# Patient Record
Sex: Female | Born: 1960 | Race: White | Hispanic: No | Marital: Single | Smoking: Former smoker
Health system: Southern US, Community
[De-identification: ages and names within clinical notes are randomized; demographics above are authoritative.]

## PROBLEM LIST (undated history)

## (undated) DIAGNOSIS — F319 Bipolar disorder, unspecified: Secondary | ICD-10-CM

## (undated) DIAGNOSIS — K759 Inflammatory liver disease, unspecified: Secondary | ICD-10-CM

## (undated) DIAGNOSIS — D649 Anemia, unspecified: Secondary | ICD-10-CM

## (undated) DIAGNOSIS — F419 Anxiety disorder, unspecified: Secondary | ICD-10-CM

## (undated) DIAGNOSIS — I10 Essential (primary) hypertension: Secondary | ICD-10-CM

## (undated) DIAGNOSIS — M1990 Unspecified osteoarthritis, unspecified site: Secondary | ICD-10-CM

## (undated) DIAGNOSIS — F603 Borderline personality disorder: Secondary | ICD-10-CM

## (undated) DIAGNOSIS — F4310 Post-traumatic stress disorder, unspecified: Secondary | ICD-10-CM

## (undated) DIAGNOSIS — G709 Myoneural disorder, unspecified: Secondary | ICD-10-CM

## (undated) DIAGNOSIS — F32A Depression, unspecified: Secondary | ICD-10-CM

## (undated) DIAGNOSIS — K7460 Unspecified cirrhosis of liver: Secondary | ICD-10-CM

## (undated) HISTORY — PX: EYE SURGERY: SHX253

## (undated) HISTORY — PX: SKIN SURGERY: SHX2413

## (undated) HISTORY — PX: CHOLECYSTECTOMY: SHX55

## (undated) HISTORY — PX: VAGINAL HYSTERECTOMY: SUR661

## (undated) HISTORY — PX: DILATION AND CURETTAGE OF UTERUS: SHX78

---

## 2017-01-22 DIAGNOSIS — F4310 Post-traumatic stress disorder, unspecified: Secondary | ICD-10-CM | POA: Insufficient documentation

## 2017-01-22 DIAGNOSIS — F603 Borderline personality disorder: Secondary | ICD-10-CM | POA: Insufficient documentation

## 2019-10-25 HISTORY — PX: GASTRIC BYPASS: SHX52

## 2019-12-25 DIAGNOSIS — C801 Malignant (primary) neoplasm, unspecified: Secondary | ICD-10-CM

## 2019-12-25 HISTORY — DX: Malignant (primary) neoplasm, unspecified: C80.1

## 2020-12-20 HISTORY — PX: WRIST FRACTURE SURGERY: SHX121

## 2020-12-24 HISTORY — PX: HERNIA REPAIR: SHX51

## 2020-12-27 DIAGNOSIS — Z87891 Personal history of nicotine dependence: Secondary | ICD-10-CM | POA: Insufficient documentation

## 2020-12-27 DIAGNOSIS — K529 Noninfective gastroenteritis and colitis, unspecified: Secondary | ICD-10-CM | POA: Insufficient documentation

## 2021-09-19 ENCOUNTER — Other Ambulatory Visit: Payer: Self-pay | Admitting: Radiation Oncology

## 2021-09-19 DIAGNOSIS — C210 Malignant neoplasm of anus, unspecified: Secondary | ICD-10-CM

## 2021-09-19 NOTE — Progress Notes (Signed)
[facility] [address], [place] [address]   CLINIC:  Medical Oncology/Hematology  CONSULT NOTE  Patient Care Team: Brien Mates, RN as Oncology Nurse Navigator (Oncology) Derek Jack, MD as Medical Oncologist (Oncology)  CHIEF COMPLAINTS/PURPOSE OF CONSULTATION:  Evaluation of anal cancer  HISTORY OF PRESENTING ILLNESS:  [name] 60 y.o. female is here because of evaluation of anal cancer, at the request of [place].  Today she reports feeling well, and she is accompanied by her sister. She reports her last coloscopy was in 2020 in [place]. She completed radiation therapy at the end of December 2021. She reports pain in her right hip and buttocks bilaterally. She reports constant headache. She has lost weight intentionally with gastric by-pass surgery, and then unintentionally with radiation therapy; she reports that she cannot regain weight despite having a good appetite. She denies losing any weight since the conclusion of her radiation therapy in December 2021. She reports history of anemia. She moved to [place] from [place] in August, and she currently lives at home with her sister. She is not currently working, but she previously worked for [employer]. She quit smoking 20 years ago after smoking 1 ppd for 15 years. Her maternal uncle had rectal cancer metastasized to the bones, and her brother had brain cancer.   MEDICAL HISTORY:  History reviewed. No pertinent past medical history.  SURGICAL HISTORY: ** The histories are not reviewed yet. Please review them in the "History" navigator section and refresh this [place].  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   Drug use: Never   Sexual activity: Not Currently  Other Topics Concern   Not on file  Social History Narrative   Not on file   Social Determinants of Health   Financial Resource Strain: Low Risk    Difficulty of Paying Living Expenses: Not hard at all  Food Insecurity: No Food Insecurity   Worried About Charity fundraiser in the Last Year: Never true   [place] in the Last Year: Never true  Transportation Needs: No Transportation Needs   Lack of Transportation (Medical): No   Lack of Transportation (Non-Medical): No  Physical Activity: Inactive   Days of Exercise per Week: 0 days   Minutes of Exercise per Session: 0 min  Stress: Stress Concern Present   Feeling of Stress : To some extent  Social Connections: Moderately Isolated   Frequency of Communication with Friends and Family: More than three times a week   Frequency of Social Gatherings with Friends and Family: More than three times a week   Attends Religious Services: More than 4 times per year   Active Member of [employer] or Organizations: No   Attends Archivist Meetings: Never   Marital Status: Never married  Human resources officer Violence: Not At Risk   Fear  of Current or Ex-Partner: No   Emotionally Abused: No   Physically Abused: No   Sexually Abused: No    FAMILY HISTORY: Family History  Problem Relation Age of Onset   Brain cancer Brother    Rectal cancer Maternal Uncle        met to bones    ALLERGIES:  is allergic to lisinopril.  MEDICATIONS:  Current Outpatient Medications  Medication Sig Dispense Refill   Lurasidone HCl (LATUDA PO) Take by mouth. Going to be weaned off of this     Multiple Vitamin (MULTIVITAMIN) tablet Take 1 tablet by mouth daily.     traZODone  (DESYREL) 100 MG tablet Take 200 mg by mouth at bedtime as needed.     venlafaxine (EFFEXOR) 100 MG tablet Take by mouth.     No current facility-administered medications for this visit.    REVIEW OF SYSTEMS:   Review of Systems  Constitutional:  Positive for fatigue. Negative for appetite change and unexpected weight change.  Cardiovascular:  Positive for chest pain and palpitations.  Gastrointestinal:  Positive for blood in stool and constipation.  Genitourinary:  Positive for difficulty urinating.   Musculoskeletal:  Positive for arthralgias (R hip), back pain and myalgias (buttock).  Neurological:  Positive for dizziness and headaches.  Psychiatric/Behavioral:  Positive for depression. The patient is nervous/anxious.   All other systems reviewed and are negative.   PHYSICAL EXAMINATION: ECOG PERFORMANCE STATUS: 1 - Symptomatic but completely ambulatory  Vitals:   09/20/21 1334  BP: 121/81  Pulse: 70  Resp: 18  Temp: 98.2 F (36.8 C)  SpO2: 100%   Filed Weights   09/20/21 1334  Weight: 139 lb 9.6 oz (63.3 kg)   Physical Exam Vitals reviewed.  Constitutional:      Appearance: Normal appearance.  Cardiovascular:     Rate and Rhythm: Normal rate and regular rhythm.     Pulses: Normal pulses.     Heart sounds: Normal heart sounds.  Pulmonary:     Effort: Pulmonary effort is normal.     Breath sounds: Normal breath sounds.  Abdominal:     Palpations: Abdomen is soft. There is no hepatomegaly, splenomegaly or mass.     Tenderness: There is no abdominal tenderness.  Musculoskeletal:     Right lower leg: No edema.     Left lower leg: No edema.  Lymphadenopathy:     Cervical: No cervical adenopathy.     Right cervical: No superficial cervical adenopathy.    Left cervical: No superficial cervical adenopathy.     Upper Body:     Right upper body: No supraclavicular, axillary or pectoral adenopathy.     Left upper body: No supraclavicular, axillary or pectoral  adenopathy.     Lower Body: No right inguinal adenopathy. No left inguinal adenopathy.  Neurological:     General: No focal deficit present.     Mental Status: She is alert and oriented to person, place, and time.  Psychiatric:        Mood and Affect: Mood normal.        Behavior: Behavior normal.     LABORATORY DATA:  I have reviewed the data as listed No flowsheet data found. No flowsheet data found.  RADIOGRAPHIC STUDIES: I have personally reviewed the radiological images as listed and agreed with the findings in the report. No results found.  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 - XRT 5400 cGy, 30 fractions, from 10/31/2020 through 12/22/2020 with 5-FU and Mitomycin-C - PET CT scan on 03/08/2021 with no evidence of metastatic disease.  Complete resolution of perirectal lymphadenopathy and right perianal uptake. - Reportedly had anoscopy by Dr. Windle Guard in July 2022 in [place].  2.  Social/family history: - She moved to [place] area from [place] in August 2022 and currently lives with her sister and husband.  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  She smoked 1 pack/day for 15 years and quit 20 years ago. - Maternal uncle died of rectal cancer and a brother died of brain tumor.   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 - She has occasional bleeding per rectum since the beginning of the year, thought to be secondary to hemorrhoids. - She had endoscopy in July 2022 by Dr. Windle Guard in [place] without any evidence of local recurrence.  She developed numbness in the right upper and lower extremity earlier this year, and was worked up  with brain imaging prior to her move to [place].  She also has some numbness in the lower back and buttock region, likely from radiation. - Physical exam today did not reveal any palpable inguinal lymphadenopathy. - Recommend anoscopy 6 to 12 months and CT scan of the abdomen and pelvis yearly during the first 3 years. - She will have anoscopy to have next year.  We will make a referral to GI service.  She will also likely need a colonoscopy as her last colonoscopy was reportedly in 2020 with a polyp removed.  2.  Gastric bypass surgery: - She had gastric bypass surgery about 2 years ago. - Her weight has been stable since she received treatments. - We will check for nutritional deficiencies including I33, folic acid and iron panel.  She will have her labs drawn and [facility].   All questions were answered. The patient knows to call the clinic with any problems, questions or concerns.   Derek Jack, MD, 09/20/21 2:29 PM  [facility] [phone]   I, Thana Ates, am acting as a scribe for Dr. Derek Jack.  I, Derek Jack MD, have reviewed the above documentation for accuracy and completeness, and I agree with the above.

## 2021-09-20 ENCOUNTER — Other Ambulatory Visit: Payer: Self-pay

## 2021-09-20 ENCOUNTER — Encounter (HOSPITAL_COMMUNITY): Payer: Self-pay | Admitting: Hematology

## 2021-09-20 ENCOUNTER — Inpatient Hospital Stay (HOSPITAL_COMMUNITY): Payer: 59 | Attending: Hematology | Admitting: Hematology

## 2021-09-20 VITALS — BP 121/81 | HR 70 | Temp 98.2°F | Resp 18 | Ht 66.0 in | Wt 139.6 lb

## 2021-09-20 DIAGNOSIS — C210 Malignant neoplasm of anus, unspecified: Secondary | ICD-10-CM | POA: Insufficient documentation

## 2021-09-20 DIAGNOSIS — Z87891 Personal history of nicotine dependence: Secondary | ICD-10-CM | POA: Diagnosis not present

## 2021-09-20 DIAGNOSIS — Z23 Encounter for immunization: Secondary | ICD-10-CM | POA: Insufficient documentation

## 2021-09-20 DIAGNOSIS — Z800 Family history of malignant neoplasm of digestive organs: Secondary | ICD-10-CM | POA: Insufficient documentation

## 2021-09-20 DIAGNOSIS — Z9884 Bariatric surgery status: Secondary | ICD-10-CM | POA: Diagnosis not present

## 2021-09-20 MED ORDER — INFLUENZA VAC SPLIT QUAD 0.5 ML IM SUSY
0.5000 mL | PREFILLED_SYRINGE | Freq: Once | INTRAMUSCULAR | Status: AC
  Administered 2021-09-20: 0.5 mL via INTRAMUSCULAR
  Filled 2021-09-20: qty 0.5

## 2021-09-20 NOTE — Patient Instructions (Signed)
[place] at [facility] Discharge Instructions  You were seen and examined today by Dr. Delton Coombes. Dr. Delton Coombes is a medical oncologist, meaning he specializes in the management of cancer diagnoses. Dr. Delton Coombes discussed your past medical history, including your previous diagnosis of anal cancer.  As of right now, your cancer is completely in remission. Dr. Delton Coombes has recommended 6 month follow-up with a repeat scan and establishment with a gastrointestinal doctor.   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 There is no medication for numbness but if you begin to experience burning or pain related to it, please call the clinic.  Dr. Delton Coombes has recommended additional lab work today. Dr. Delton Coombes will check your blood counts, electrolytes and iron studies.  Follow-up as scheduled.   Thank you for choosing [place] at [facility] to provide your oncology and hematology care.  To afford each patient quality time with our provider, please arrive at least 15 minutes before your scheduled appointment time.   If you have a lab appointment with the [facility] please come in thru the Main Entrance and check in at the main information desk.  You need to re-schedule your appointment should you arrive 10 or more minutes late.  We strive to give you quality time with our providers, and arriving late affects you and other patients whose appointments are after yours.  Also, if you no show three or more times for appointments you may be dismissed from the clinic at the providers discretion.     Again, thank you for choosing [facility].  Our hope is that these requests will decrease the amount of time that you wait before being seen by our physicians.       _____________________________________________________________  Should you have questions after your visit to [facility], please contact  our office at [phone] and follow the prompts.  Our office hours are 8:00 a.m. and 4:30 p.m. Monday - Friday.  Please note that voicemails left after 4:00 p.m. may not be returned until the following business day.  We are closed weekends and major holidays.  You do have access to a nurse 24-7, just call the main number to the clinic [phone] and do not press any options, hold on the line and a nurse will answer the phone.    For prescription refill requests, have your pharmacy contact our office and allow 72 hours.    Due to Covid, you will need to wear a mask upon entering the hospital. If you do not have a mask, a mask will be given to you at the Main Entrance upon arrival. For doctor visits, patients may have 1 support person age 100 or older with them. For treatment visits, patients can not have anyone with them due to social distancing guidelines and our immunocompromised population.

## 2021-09-20 NOTE — Progress Notes (Signed)
Patient to receive flu shot today per Dr. Katragadda. Patient reports no prior adverse reactions to previous flu shots. Flu shot given per Dr. Katragadda. Patient tolerated well without complaint. Site covered with bandaid. Patient discharged from clinic ambulatory and in satisfactory condition with family.  

## 2021-11-22 ENCOUNTER — Telehealth (HOSPITAL_COMMUNITY): Payer: Self-pay | Admitting: *Deleted

## 2021-11-22 NOTE — Telephone Encounter (Signed)
Patient called stating that she is having intractable lower back pain.  Takes tylenol and utilizes lidoderm patches without relief.  She verbalizes that this has just started recently and is s/p radiation to that area.  Would like a follow up appointment to see Dr Delton Coombes to assess.

## 2021-11-27 NOTE — Progress Notes (Signed)
[name], [place] [address]   CLINIC:  Medical Oncology/Hematology  PCP:  No primary care provider on file. No primary physician on file. None   REASON FOR VISIT:  Follow-up for anal cancer  PRIOR THERAPY: XRT completed December 2021  NGS Results: not done  CURRENT THERAPY: under work-up  BRIEF ONCOLOGIC HISTORY:  Oncology History  Anal cancer ([place])  09/20/2021 Initial Diagnosis   Anal cancer ([place])   09/20/2021 Cancer Staging   Staging form: Anus, AJCC 8th Edition - Clinical stage from 09/20/2021: Stage IIIA (ycT2, cN1a, cM0) - Signed by Derek Jack, MD on 09/20/2021 Stage prefix: Post-therapy Histologic grade (G): G3 Histologic grading system: 4 grade system P16 overexpression: Positive      CANCER STAGING:  Cancer Staging  Anal cancer ([place]) Staging form: Anus, AJCC 8th Edition - Clinical stage from 09/20/2021: Stage IIIA (ycT2, cN1a, cM0) - Signed by Derek Jack, MD on 09/20/2021   INTERVAL HISTORY:  [name], a 60 y.o. female, returns for routine follow-up of her anal cancer. [name] was last seen on 09/20/2021.   Today she reports feeling well. She reports a history of fatty liver. She reports constant dull lower back pain which radiates to the buttock starting 1 month ago which she rates 7 out of 10 at its worse and 3 out of 10 at its baseline; this pain is worsened by bending over and improved by laying flat. She is taking three 500 mg tylenol tablets every 8 hours as needed for this pain.   REVIEW OF SYSTEMS:  Review of Systems  Constitutional:  Negative for appetite change.  Musculoskeletal:  Positive for back pain (6/10 lower).  Neurological:  Positive for numbness.  Psychiatric/Behavioral:  Positive for depression.   All other systems reviewed and are negative.  PAST MEDICAL/SURGICAL HISTORY:  No past medical history on file. Past Surgical History:  Procedure Laterality Date    GASTRIC BYPASS  10/2019   HERNIA REPAIR  2022   hernia repair and mass removal   WRIST FRACTURE SURGERY Left 12/20/2020    SOCIAL HISTORY:  Social History   Socioeconomic History   Marital status: Single    Spouse name: Not on file   Number of children: Not on file   Years of education: Not on file   Highest education level: Not on file  Occupational History   Not on file  Tobacco Use   Smoking status: Former    Types: Cigarettes    Quit date: 2002    Years since quitting: 20.9   Smokeless tobacco: Never  Vaping Use   Vaping Use: Never used  Substance and Sexual Activity   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   Drug use: Never   Sexual activity: Not Currently  Other Topics Concern   Not on file  Social History Narrative   Not on file   Social Determinants of Health   Financial Resource Strain: Low Risk    Difficulty of Paying Living Expenses: Not hard at all  Food Insecurity: No Food Insecurity   Worried About Charity fundraiser in the Last Year: Never true   [place] in the Last Year: Never true  Transportation Needs: No Transportation Needs   Lack of Transportation (Medical): No   Lack of Transportation (Non-Medical): No  Physical Activity: Inactive   Days of Exercise per Week: 0 days   Minutes of Exercise per  Session: 0 min  Stress: Stress Concern Present   Feeling of Stress : To some extent  Social Connections: Moderately Isolated   Frequency of Communication with Friends and Family: More than three times a week   Frequency of Social Gatherings with Friends and Family: More than three times a week   Attends Religious Services: More than 4 times per year   Active Member of [employer] or Organizations: No   Attends Music therapist: Never   Marital Status: Never married  Human resources officer Violence: Not At Risk   Fear of Current or Ex-Partner: No   Emotionally Abused: No   Physically Abused: No   Sexually Abused:  No    FAMILY HISTORY:  Family History  Problem Relation Age of Onset   Brain cancer Brother    Rectal cancer Maternal Uncle        met to bones    CURRENT MEDICATIONS:  Current Outpatient Medications  Medication Sig Dispense Refill   diphenoxylate-atropine (LOMOTIL) 2.5-0.025 MG tablet Take by mouth.     gabapentin (NEURONTIN) 300 MG capsule Take 300 mg by mouth daily.     lamoTRIgine (LAMICTAL) 25 MG tablet Take by mouth.     Lurasidone HCl (LATUDA PO) Take by mouth. Going to be weaned off of this     Multiple Vitamin (MULTIVITAMIN) tablet Take 1 tablet by mouth daily.     omeprazole (PRILOSEC) 20 MG capsule      venlafaxine (EFFEXOR) 100 MG tablet Take by mouth.     traZODone (DESYREL) 100 MG tablet Take 200 mg by mouth at bedtime as needed. (Patient not taking: Reported on 11/28/2021)     No current facility-administered medications for this visit.    ALLERGIES:  Allergies  Allergen Reactions   Lisinopril Cough    PHYSICAL EXAM:  Performance status (ECOG): 1 - Symptomatic but completely ambulatory  There were no vitals filed for this visit. Wt Readings from Last 3 Encounters:  09/20/21 139 lb 9.6 oz (63.3 kg)   Physical Exam Vitals reviewed.  Constitutional:      Appearance: Normal appearance.  Cardiovascular:     Rate and Rhythm: Normal rate and regular rhythm.     Pulses: Normal pulses.     Heart sounds: Normal heart sounds.  Pulmonary:     Effort: Pulmonary effort is normal.     Breath sounds: Normal breath sounds.  Musculoskeletal:     Lumbar back: Tenderness (over sacrum) present.  Neurological:     General: No focal deficit present.     Mental Status: She is alert and oriented to person, place, and time.  Psychiatric:        Mood and Affect: Mood normal.        Behavior: Behavior normal.     LABORATORY DATA:  I have reviewed the labs as listed.  No flowsheet data found. No flowsheet data found.  DIAGNOSTIC IMAGING:  I have independently  reviewed the scans and discussed with the patient. No results found.   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 - XRT 5400 cGy, 30 fractions, from 10/31/2020 through 12/22/2020 with 5-FU and Mitomycin-C - PET CT scan on 03/08/2021 with no evidence of metastatic disease.  Complete resolution of perirectal lymphadenopathy and right perianal uptake. - Reportedly had anoscopy by Dr.  Conner in July 2022 in [place]. - Endoscopy in July 2022 by Dr. Windle Guard in [place] without any evidence of local recurrence.  She developed numbness in the right upper and lower extremity earlier this year, worked up with brain imaging prior to her moved to [place].  She also has some numbness in the lower back and buttock region, likely from radiation.  2.  Social/family history: - She moved to [place] area from [place] in August 2022 and currently lives with her sister and husband.  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  She smoked 1 pack/day for 15 years and quit 20 years ago. - Maternal uncle died of rectal cancer and a brother died of brain tumor.   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 Barry Dienes on 11/27/2021. - Anoscopy did not reveal any recurrence of tumor. - She has a follow-up with Dr. Barry Dienes in 3 months. - We will plan to arrange for a CT AP with contrast around March of next year.  Will do imaging once a year.  2.  Gastric bypass surgery: -She had a gastric bypass surgery about 2 years ago. - We have reviewed labs from 09/20/2021 which showed normal V85, folic acid and ferritin levels.  3.  Low back pain: - She reports 1 month history of low back pain.  No radiation.  Baseline it is 3  out of 10 and can get to 6-7 out of 10.  Reported as dull pain.  She is taking Tylenol 500 mg up to 3 tablets daily.  She is not able to take NSAIDs because of her history of gastric bypass.  Pain is worse on bending over and gets better on lying flat. - I have recommended x-ray of the lumbar spine. - We will start her on Flexeril 5 mg every 8 hours as needed.  If there is no improvement, will consider tramadol. - RTC 6 weeks for follow-up.  She was told to call us if the pain is not improving.  We will consider MRI of the lumbar spine.   Orders placed this encounter:  Orders Placed This Encounter  Procedures   DG Lumbar Spine Complete      Derek Jack, MD [place] [phone]   I, Thana Ates, am acting as a scribe for Dr. Derek Jack.  I, Derek Jack MD, have reviewed the above documentation for accuracy and completeness, and I agree with the above.

## 2021-11-28 ENCOUNTER — Inpatient Hospital Stay (HOSPITAL_COMMUNITY): Payer: 59 | Attending: Hematology | Admitting: Hematology

## 2021-11-28 ENCOUNTER — Other Ambulatory Visit: Payer: Self-pay

## 2021-11-28 ENCOUNTER — Ambulatory Visit (HOSPITAL_COMMUNITY)
Admission: RE | Admit: 2021-11-28 | Discharge: 2021-11-28 | Disposition: A | Discharge status: Home / Self Care | Payer: 59 | Source: Ambulatory Visit | Attending: Hematology | Admitting: Hematology

## 2021-11-28 DIAGNOSIS — C210 Malignant neoplasm of anus, unspecified: Secondary | ICD-10-CM

## 2021-11-28 MED ORDER — CYCLOBENZAPRINE HCL 5 MG PO TABS: 5.0000 mg | ORAL_TABLET | Freq: Three times a day (TID) | ORAL | 0 refills | Status: DC | PRN

## 2021-11-28 NOTE — Patient Instructions (Signed)
[place] at [facility] Discharge Instructions  You were seen and examined today by Dr. Delton Coombes. Have your xray done and call us in 4 weeks if you are still having pain. We will call with the results of your xray. He sent flexeril into the pharmacy for you to take and see if it will help with the pain you are having.   Thank you for choosing [place] at [facility] to provide your oncology and hematology care.  To afford each patient quality time with our provider, please arrive at least 15 minutes before your scheduled appointment time.   If you have a lab appointment with the [facility] please come in thru the Main Entrance and check in at the main information desk.  You need to re-schedule your appointment should you arrive 10 or more minutes late.  We strive to give you quality time with our providers, and arriving late affects you and other patients whose appointments are after yours.  Also, if you no show three or more times for appointments you may be dismissed from the clinic at the providers discretion.     Again, thank you for choosing [facility].  Our hope is that these requests will decrease the amount of time that you wait before being seen by our physicians.       _____________________________________________________________  Should you have questions after your visit to [facility], please contact our office at [phone] and follow the prompts.  Our office hours are 8:00 a.m. and 4:30 p.m. Monday - Friday.  Please note that voicemails left after 4:00 p.m. may not be returned until the following business day.  We are closed weekends and major holidays.  You do have access to a nurse 24-7, just call the main number to the clinic [phone] and do not press any options, hold on the line and a nurse will answer the phone.    For prescription refill requests, have your pharmacy contact our office and  allow 72 hours.    Due to Covid, you will need to wear a mask upon entering the hospital. If you do not have a mask, a mask will be given to you at the Main Entrance upon arrival. For doctor visits, patients may have 1 support person age 69 or older with them. For treatment visits, patients can not have anyone with them due to social distancing guidelines and our immunocompromised population.

## 2021-12-07 ENCOUNTER — Telehealth (HOSPITAL_COMMUNITY): Payer: Self-pay | Admitting: *Deleted

## 2021-12-07 NOTE — Telephone Encounter (Signed)
-----   Message from Derek Jack, MD sent at 12/06/2021  4:03 PM EST ----- Please call this patient with the results of the x-ray of the lumbar spine.  Some degenerative changes contributing to her low back pain.  Thanks.

## 2021-12-07 NOTE — Telephone Encounter (Signed)
Patient called and notified of results.  All questions answered and verbalized understanding.

## 2022-01-22 ENCOUNTER — Ambulatory Visit (HOSPITAL_COMMUNITY): Payer: 59 | Admitting: Hematology

## 2022-01-26 ENCOUNTER — Encounter: Payer: Self-pay | Admitting: Physical Therapy

## 2022-01-26 ENCOUNTER — Other Ambulatory Visit: Payer: Self-pay

## 2022-01-26 ENCOUNTER — Ambulatory Visit: Payer: 59 | Attending: General Surgery | Admitting: Physical Therapy

## 2022-01-26 DIAGNOSIS — C211 Malignant neoplasm of anal canal: Secondary | ICD-10-CM | POA: Diagnosis not present

## 2022-01-26 DIAGNOSIS — N895 Stricture and atresia of vagina: Secondary | ICD-10-CM | POA: Diagnosis not present

## 2022-01-26 DIAGNOSIS — R279 Unspecified lack of coordination: Secondary | ICD-10-CM

## 2022-01-26 DIAGNOSIS — M6281 Muscle weakness (generalized): Secondary | ICD-10-CM

## 2022-01-26 DIAGNOSIS — M62838 Other muscle spasm: Secondary | ICD-10-CM

## 2022-01-26 NOTE — Patient Instructions (Addendum)
[email or web address].[email or web address]   Lubrication Used for intercourse to reduce friction Avoid ones that have glycerin, nonoxynol-9, petroleum, propylene glycol, chlorhexidine gluconate, warming gels, tingling gels, icing or cooling gel, scented Avoid parabens due to a preservative similar to female sex hormone May need to be reapplied once or several times during sexual activity Can be applied to both partners genitals prior to vaginal penetration to minimize friction or irritation Prevent irritation and mucosal tears that cause post coital pain and increased the risk of vaginal and urinary tract infections Oil-based lubricants cannot be used with condoms due to breaking them down.  Least likely to irritate vaginal tissue.  Plant based-lubes are safe Silicone-based lubrication are thicker and last long and used for post-menopausal women  Vaginal Lubricators Here is a list of some suggested lubricators you can use for intercourse. Use the most hypoallergenic product.  You can place on you or your partner.  Slippery Stuff ( water based) Sylk or Sliquid Natural H2O ( good  if frequent UTIs)- walmart, amazon Sliquid organics silk-(aloe and silicone based ) [employer] (www.blossom-organics.com)- (aloe based ) Coconut oil, olive oil -not good with condoms  PJur Woman Nude- (water based) amazon Uberlube- ( silicon) [employer] has an organic one Yes lubricant- (water based and has plant oil based similar to silicone) [employer] Platinum-Silicone, [employer], [facility] Olive and Bee intimate cream-  www.oliveandbee.com.au Pink - [employer] stuff Erosense Sync- walmart, amazon Coconu- FailLinks.co.uk  Things to avoid in lubricants are glycerin, warming gels, tingling gels, icing or cooling  gels, and scented gels.  Also avoid Vaseline. KY jelly, Replens, and Astroglide contain chlorhexidine which kills good bacteria(lactobacilli)  Things to avoid in the vaginal  area Do not use things to irritate the vulvar area No lotions- see below Soaps you  can use :Aveeno, Calendula, Good Clean Love cleanser if needed. Must be gentle No deodorants No douches Good to sleep without underwear to let the vaginal area to air out No scrubbing: spread the lips to let warm water rinse over labias and pat dry  Creams that can be used on the Vulva Area V [employer], walmart Vital V Wild Yam Salve Julva- [employer] Botanical Pro-Meno Wild Yam Cream Coconut oil, olive oil Cleo by [employer] labial moisturizer -[place],  [place] Releveum ( lidocaine) or Desert [employer] Yes Moisturizer

## 2022-01-26 NOTE — Therapy (Signed)
[facility] @ [place], [place], [address] Phone: [phone]   Fax:  [phone]  Physical Therapy Evaluation  Patient Details  Name: Barbara Meyer MRN: [id number] Date of Birth: October 13, 1961 Referring Provider (PT): Leighton Ruff, MD   Encounter Date: 01/26/2022   PT End of Session - 01/26/22 1154     Visit Number 1    Date for PT Re-Evaluation 04/25/22    Authorization Type Aetna    PT Start Time 1107    PT Stop Time 1147    PT Time Calculation (min) 40 min    Activity Tolerance Patient tolerated treatment well    Behavior During Therapy [facility] for tasks assessed/performed             History reviewed. No pertinent past medical history.  Past Surgical History:  Procedure Laterality Date   GASTRIC BYPASS  10/2019   HERNIA REPAIR  2022   hernia repair and mass removal   WRIST FRACTURE SURGERY Left 12/20/2020    There were no vitals filed for this visit.    Subjective Assessment - 01/26/22 1112     Subjective Pt reports she has had pain with intercourse ~61yo but no longer sexually active, diagnosed with anal CA 09/21, chemo/radiation 11/21, surgery for removal in 9/21. Completed treatments. Was given vaginal dilator post treatment however only used one time due pain. Pt now reporting pain with vaginal penetration during medical testing and MD referred her to decrease pelvic pain.    Pertinent History x3 births 1 c-section 2 vaginal; anal cancer    How long can you sit comfortably? no limits    How long can you stand comfortably? no limits    How long can you walk comfortably? no limits    Currently in Pain? 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  No    Is the patient reluctant to leave their home because of a fear of falling?  No      Home Ecologist residence    Living Arrangements Other relatives      Prior Function   Level of Independence Independent      Cognition   Overall Cognitive Status Within Functional Limits for tasks assessed      Sensation   Light Touch Appears Intact      Coordination   Gross Motor Movements are Fluid and Coordinated Yes    Fine Motor Movements are Fluid and Coordinated Yes      Posture/Postural Control   Posture/Postural Control No significant limitations      ROM / Strength   AROM / PROM / Strength AROM;Strength  AROM   Overall AROM Comments thoracic and lumbar spine limited by 25% in side bending all others WFL.      Flexibility   Soft Tissue Assessment /Muscle Length yes   bil hamstrings and adductors limited by 25%     Palpation   Palpation comment no TTP throughout abdomen, bil glutes, quads, adductors, or pubic symphasis                   No emotional/communication barriers or cognitive limitation. Patient is motivated to learn. Patient understands and agrees with treatment goals and plan. PT explains patient will be examined in standing, sitting, and lying down to see how their muscles and joints work. When they are ready, they will be asked to remove their underwear so PT can examine their perineum. The patient is also given the option of providing their own chaperone as one is not provided in our facility. The patient also has the right and is explained the right to defer or refuse any part of the  evaluation or treatment including the internal exam. With the patient's consent, PT will use one gloved finger to gently assess the muscles of the pelvic floor, seeing how well it contracts and relaxes and if there is muscle symmetry. After, the patient will get dressed and PT and patient will discuss exam findings and plan of care. PT and patient discuss plan of care, schedule, attendance policy and HEP activities.      Objective measurements completed on examination: See above findings.     Pelvic Floor Special Questions - 01/26/22 0001     Prior Pelvic/Prostate Exam No    Are you Pregnant or attempting pregnancy? No    Currently Sexually Active No    History of sexually transmitted disease Yes   at 61yo but has been treated   [facility] Scale pain prevents any attempts at intercourse   pt reports she had pain with sex previously but not seeing anyone right now. Does report she would like to be sexually active but worried she is unable   Urinary Leakage Yes    How often "every once in awhile"    Pad use no    Activities that cause leaking Other    Other activities that cause leaking sometimes will have post void leakage but not all the time    Urinary urgency Yes   sometimes but not often   Urinary frequency sometimes going more often than every 2 hours    Fecal incontinence No   denies constipation; sometimes feels like she needs to strain, sometimes will have rectal bleeding but unsure if thats related to straining, reports MD said this was from radiation. Has BM 1-2x per day, usually type 5.   Fluid intake ~64oz  bottle x3 per day    Caffeine beverages coffee 3 cups per day    Falling out feeling (prolapse) No    External Perineal Exam mild dryness noted    Pelvic Floor Internal Exam patient identified and patient confirms consent for PT to perform internal soft tissue work and muscle strength and integrity assessment    Exam Type Vaginal    Sensation WFL    Palpation TTP throughout  Right side in superficial and deep muscle layers    Strength weak squeeze, no lift   however greatly increased tone noted throughout pelvic floor and at introitus   Strength # of reps 6    Strength # of seconds 5    Tone increased  throughout                       PT Education - 01/26/22 1154     Education Details Pt educated on exam findings, POC, HEP, pelvic relaxation and vaginal lumbricants    Person(s) Educated Patient    Methods Explanation;Demonstration;Tactile cues;Verbal cues;Handout    Comprehension Returned demonstration;Verbalized understanding              PT Short Term Goals - 01/26/22 1205       PT SHORT TERM GOAL #1   Title pt to be I with HEP    Time 4    Period Weeks    Status New    Target Date 02/23/22      PT SHORT TERM GOAL #2   Title Pt to report ability to tolerate vaginal penetration with no more than 5/10 pain for improved tolerance to vaginal penetration.    Time 4    Period Weeks    Status New    Target Date 02/23/22               PT Long Term Goals - 01/26/22 1207       PT LONG TERM GOAL #1   Title pt to be I with advanced HEP    Time 3    Period Months    Status New    Target Date 04/25/22      PT LONG TERM GOAL #2   Title pt to report no more than 2/10 pain with vaginal dilator use for improved tolerance to vaginal penetration.    Time 3    Period Months    Status New    Target Date 04/25/22      PT LONG TERM GOAL #3   Title pt to demonstrate improved ability to contract/relax/bulge/relax pelvic floor at least 75% of the time to demonstrate improved pelvic floor tone for less pelvic pain.    Time 3    Period Months    Status New    Target Date 04/25/22      PT LONG TERM GOAL #4   Title pt to demonstrate at least 4/5 pelvic floor strength with ability to fully relax post contraction for improved pelvic stability and decreased pain.    Time 3    Period Months    Status New    Target Date 04/25/22                     Plan - 01/26/22 1155     Clinical Impression Statement Pt is 61yo female presenting to clinic with history of stage 3 anal cancer, found 08/2020 and underwent removal 08/2020, chemo radiation treatment completed 11/2020. Pt has since had PET CT scan on 03/08/2021 with no evidence of metastatic disease. Endoscopy in July 2022 by Dr. Windle Guard in [place] without any evidence of local recurrence per chart review. Pt presents today with pelvic pain with vaginal penetration s/p radiation. Pt does report the last time she had intercourse ~7 years ago, it was somewhat painful. Pt reports after treatment cancer her doctor gave her a vaginal dilator to use wth lubricant and she attempted one time then never tried again due to it being painful. Pt reports she would like to be able to be sexually active but feels limited due to pain and also like she can't initiate a romantic relationship due to these limitations at this time, pt also reports she was unable to tolerate  a vaginal exam or insertion of "imaging wand" during a vaginal exam before due to pain as well. Pt found to have decreased mobility in spine and bil hips, consented to internal vaginal pelvic floor assessment this session and did have increased tone throughout pelvic floor at superficial and deep muscle layers at introitus, pt did report TTP throughout right side but not as much on the left. Pt also demonstrated decreased strength, endurance and coordination with poor ability to relax between contractions noted. Pt would benefit from additional PT to improve QOL and safety without pain for vaginal penetration for medical assessments as needed and for intercourse.    Personal Factors and Comorbidities Age;Time since onset of injury/illness/exacerbation;Comorbidity 1    Comorbidities h/o s3 anal cancer now post treatment and removal.    Examination-Activity Limitations Other   vaginal penetration   Examination-Participation  Restrictions Interpersonal Relationship    Stability/Clinical Decision Making Stable/Uncomplicated    Clinical Decision Making Low    Rehab Potential Good    PT Frequency 1x / week    PT Duration Other (comment)   10 weeks   PT Treatment/Interventions ADLs/Self Care Home Management;Aquatic Therapy;Patient/family education;Therapeutic activities;Functional mobility training;Therapeutic exercise;Neuromuscular re-education;Manual techniques;Taping;Scar mobilization;Passive range of motion;Energy conservation    PT Next Visit Plan go over all handouts, manual work at hips, spine and pelvis, stretching/relaxation    PT Home Exercise Plan [id number]    Consulted and Agree with Plan of Care Patient             Patient will benefit from skilled therapeutic intervention in order to improve the following deficits and impairments:  Decreased coordination, Decreased endurance, Pain, Impaired tone, Increased fascial restricitons, Decreased scar mobility, Impaired flexibility, Improper body mechanics, Postural dysfunction, Decreased strength, Decreased mobility  Visit Diagnosis: Muscle weakness (generalized) - Plan: PT plan of care cert/re-cert  Unspecified lack of coordination - Plan: PT plan of care cert/re-cert  Other muscle spasm - Plan: PT plan of care cert/re-cert     Problem List Patient Active Problem List   Diagnosis Date Noted   Anal cancer ([place]) 09/20/2021   Stacy Gardner, PT, DPT 01/26/2311:10 PM   [facility] @ [place], [place], [address] Phone: [phone]   Fax:  [phone]  Name: Leatha Rohner MRN: [id number] Date of Birth: 1961/08/20

## 2022-01-30 DIAGNOSIS — F419 Anxiety disorder, unspecified: Secondary | ICD-10-CM | POA: Diagnosis not present

## 2022-01-30 DIAGNOSIS — F4312 Post-traumatic stress disorder, chronic: Secondary | ICD-10-CM | POA: Diagnosis not present

## 2022-01-30 DIAGNOSIS — G4700 Insomnia, unspecified: Secondary | ICD-10-CM | POA: Diagnosis not present

## 2022-01-30 DIAGNOSIS — R69 Illness, unspecified: Secondary | ICD-10-CM | POA: Diagnosis not present

## 2022-01-30 DIAGNOSIS — Z79899 Other long term (current) drug therapy: Secondary | ICD-10-CM | POA: Diagnosis not present

## 2022-02-14 ENCOUNTER — Encounter: Payer: 59 | Admitting: Physical Therapy

## 2022-02-19 DIAGNOSIS — F4312 Post-traumatic stress disorder, chronic: Secondary | ICD-10-CM | POA: Diagnosis not present

## 2022-02-19 DIAGNOSIS — G4700 Insomnia, unspecified: Secondary | ICD-10-CM | POA: Diagnosis not present

## 2022-02-19 DIAGNOSIS — F419 Anxiety disorder, unspecified: Secondary | ICD-10-CM | POA: Diagnosis not present

## 2022-02-19 DIAGNOSIS — Z79899 Other long term (current) drug therapy: Secondary | ICD-10-CM | POA: Diagnosis not present

## 2022-02-19 DIAGNOSIS — R69 Illness, unspecified: Secondary | ICD-10-CM | POA: Diagnosis not present

## 2022-02-21 ENCOUNTER — Other Ambulatory Visit: Payer: Self-pay

## 2022-02-21 ENCOUNTER — Ambulatory Visit: Payer: 59 | Attending: General Surgery | Admitting: Physical Therapy

## 2022-02-21 DIAGNOSIS — M6281 Muscle weakness (generalized): Secondary | ICD-10-CM

## 2022-02-21 DIAGNOSIS — M62838 Other muscle spasm: Secondary | ICD-10-CM

## 2022-02-21 DIAGNOSIS — R279 Unspecified lack of coordination: Secondary | ICD-10-CM

## 2022-02-21 NOTE — Therapy (Signed)
[facility] ?[facility] @ [place] ?[place], [place], [address] ?Phone: [phone]   Fax:  [phone] ? ?Physical Therapy Treatment ? ?Patient Details  ?Name: Barbara Meyer ?MRN: [id number] ?Date of Birth: [birth year] ?Referring Provider (PT): Leighton Ruff, MD ? ? ?Encounter Date: 02/21/2022 ? ? PT End of Session - 02/21/22 1103   ? ? Visit Number 2   ? Date for PT Re-Evaluation 04/25/22   ? Authorization Type Aetna   ? PT Start Time 1101   ? PT Stop Time 4097   ? PT Time Calculation (min) 42 min   ? Activity Tolerance Patient tolerated treatment well   ? Behavior During Therapy [facility] for tasks assessed/performed   ? ?  ?  ? ?  ? ? ?No past medical history on file. ? ?Past Surgical History:  ?Procedure Laterality Date  ? GASTRIC BYPASS  10/2019  ? HERNIA REPAIR  2022  ? hernia repair and mass removal  ? WRIST FRACTURE SURGERY Left 12/20/2020  ? ? ?There were no vitals filed for this visit. ? ? Subjective Assessment - 02/21/22 1104   ? ? Subjective Pt reports she has had pain with intercourse ~61yo but no longer sexually active, diagnosed with anal CA 09/21, chemo/radiation 11/21, surgery for removal in 9/21. Completed treatments. Was given vaginal dilator post treatment however only used one time due pain. Pt now reporting pain with vaginal penetration during medical testing and MD referred her to decrease pelvic pain.   ? Pertinent History x3 births 1 c-section 2 vaginal; anal cancer   ? How long can you sit comfortably? no limits   ? How long can you stand comfortably? no limits   ? How long can you walk comfortably? no limits   ? Currently in Pain? No/denies   ? ?  ?  ? ?  ? ? ? ? ? ? ? ? ? ? ? ? ? ? ? ? ? ? ? ? [facility] Adult PT Treatment/Exercise - 02/21/22 0001   ? ?  ? Self-Care  ? Self-Care Other Self-Care Comments   ? Other Self-Care Comments  pt educated on voiding mechanics and breathing mechanics to decrease straining and improve fully emptying of bowels.   ?  ?  Neuro Re-ed   ? Neuro Re-ed Details  x10 "straw" or "balloon" breathing while feet on squatty potty for improved mechanics to decrease straining   ?  ? Exercises  ? Exercises Lumbar;Knee/Hip   ?  ? Lumbar Exercises: Stretches  ? Passive Hamstring Stretch Right;Left;2 reps;30 seconds   ? Single Knee to Chest Stretch --   ? Hip Flexor Stretch --   ? Pelvic Tilt 10 reps   ? ITB Stretch Right;Left;2 reps;30 seconds   ? Piriformis Stretch Right;Left;30 seconds   ? Other Lumbar Stretch Exercise butterfly 2x45s   ? ?  ?  ? ?  ? ? ? ? ? ? ? ? ? ? PT Education - 02/21/22 1143   ? ? Education Details Pt educated on voiding mechanics, breathing mechanics, and continued HEP as well as attempting use of vaginal dilator at home as tolerated.   ? Person(s) Educated Patient   ? Methods Explanation;Demonstration;Tactile cues;Verbal cues;Handout   ? Comprehension Returned demonstration;Verbalized understanding   ? ?  ?  ? ?  ? ? ? PT Short Term Goals - 01/26/22 1205   ? ?  ? PT SHORT TERM GOAL #1  ? Title pt to be I with  HEP   ? Time 4   ? Period Weeks   ? Status New   ? Target Date 02/23/22   ?  ? PT SHORT TERM GOAL #2  ? Title Pt to report ability to tolerate vaginal penetration with no more than 5/10 pain for improved tolerance to vaginal penetration.   ? Time 4   ? Period Weeks   ? Status New   ? Target Date 02/23/22   ? ?  ?  ? ?  ? ? ? ? PT Long Term Goals - 01/26/22 1207   ? ?  ? PT LONG TERM GOAL #1  ? Title pt to be I with advanced HEP   ? Time 3   ? Period Months   ? Status New   ? Target Date 04/25/22   ?  ? PT LONG TERM GOAL #2  ? Title pt to report no more than 2/10 pain with vaginal dilator use for improved tolerance to vaginal penetration.   ? Time 3   ? Period Months   ? Status New   ? Target Date 04/25/22   ?  ? PT LONG TERM GOAL #3  ? Title pt to demonstrate improved ability to contract/relax/bulge/relax pelvic floor at least 75% of the time to demonstrate improved pelvic floor tone for less pelvic pain.   ? Time 3    ? Period Months   ? Status New   ? Target Date 04/25/22   ?  ? PT LONG TERM GOAL #4  ? Title pt to demonstrate at least 4/5 pelvic floor strength with ability to fully relax post contraction for improved pelvic stability and decreased pain.   ? Time 3   ? Period Months   ? Status New   ? Target Date 04/25/22   ? ?  ?  ? ?  ? ? ? ? ? ? ? ? Plan - 02/21/22 1143   ? ? Clinical Impression Statement Pt presents to clinic reporting she was not able to complete HEP until recently and had not tried dilators yet. Pt session focused on stretching at hips and spine for improved mobility and decreased pelvic tension. Pt also educated on mechanics for breathing and voiding with use of step stool for BMs to decrease straining and improve ergonomics. Pt verbalized understanding and portion of treatment used for pt to have NMRE in position with squatty potty for carry over for home and x10 balloon breathing for improved techniques. Pt tolerated well without pain and would benefit from additional PT to improve QOL and safety without pain for vaginal penetration for medical assessments as needed and for intercourse.   ? Personal Factors and Comorbidities Age;Time since onset of injury/illness/exacerbation;Comorbidity 1   ? Comorbidities h/o s3 anal cancer now post treatment and removal.   ? Examination-Activity Limitations Other   vaginal penetration  ? Examination-Participation Restrictions Interpersonal Relationship   ? Stability/Clinical Decision Making Stable/Uncomplicated   ? Rehab Potential Good   ? PT Frequency 1x / week   ? PT Duration Other (comment)   10 weeks  ? PT Treatment/Interventions ADLs/Self Care Home Management;Aquatic Therapy;Patient/family education;Therapeutic activities;Functional mobility training;Therapeutic exercise;Neuromuscular re-education;Manual techniques;Taping;Scar mobilization;Passive range of motion;Energy conservation   ? PT Next Visit Plan , manual work at hips, spine and pelvis,  stretching/relaxation   ? PT Home Exercise Plan [id number]   ? Consulted and Agree with Plan of Care Patient   ? ?  ?  ? ?  ? ? ?Patient will benefit  from skilled therapeutic intervention in order to improve the following deficits and impairments:  Decreased coordination, Decreased endurance, Pain, Impaired tone, Increased fascial restricitons, Decreased scar mobility, Impaired flexibility, Improper body mechanics, Postural dysfunction, Decreased strength, Decreased mobility ? ?Visit Diagnosis: ?Other muscle spasm ? ?Unspecified lack of coordination ? ?Muscle weakness (generalized) ? ? ? ? ?Problem List ?Patient Active Problem List  ? Diagnosis Date Noted  ? Anal cancer ([place]) 09/20/2021  ? ?Stacy Gardner, PT, DPT ?02/21/2310:47 AM  ? ?[facility] ?[facility] @ [place] ?[place], [place], [address] ?Phone: [phone]   Fax:  [phone] ? ?Name: Loleta Frommelt ?MRN: [id number] ?Date of Birth: 06-05-1961 ? ? ? ?

## 2022-02-22 DIAGNOSIS — R4586 Emotional lability: Secondary | ICD-10-CM | POA: Diagnosis not present

## 2022-02-22 DIAGNOSIS — F419 Anxiety disorder, unspecified: Secondary | ICD-10-CM | POA: Diagnosis not present

## 2022-02-22 DIAGNOSIS — R69 Illness, unspecified: Secondary | ICD-10-CM | POA: Diagnosis not present

## 2022-02-28 ENCOUNTER — Other Ambulatory Visit: Payer: Self-pay

## 2022-02-28 ENCOUNTER — Ambulatory Visit: Payer: 59 | Admitting: Physical Therapy

## 2022-02-28 DIAGNOSIS — M62838 Other muscle spasm: Secondary | ICD-10-CM | POA: Diagnosis not present

## 2022-02-28 DIAGNOSIS — R279 Unspecified lack of coordination: Secondary | ICD-10-CM | POA: Diagnosis not present

## 2022-02-28 DIAGNOSIS — M6281 Muscle weakness (generalized): Secondary | ICD-10-CM | POA: Diagnosis not present

## 2022-02-28 NOTE — Therapy (Signed)
[facility] ?[place], [place], [address] ?Phone: [phone]   Fax:  [phone] ? ?Physical Therapy Treatment ? ?Patient Details  ?Name: Barbara Meyer ?MRN: [id number] ?Date of Birth: 08/04/1961 ?Referring Provider (PT): Leighton Ruff, MD ? ? ?Encounter Date: 02/28/2022 ? ? PT End of Session - 02/28/22 0755   ? ? Visit Number 3   ? Date for PT Re-Evaluation 04/25/22   ? Authorization Type Aetna   ? PT Start Time 0800   ? PT Stop Time 0844   ? PT Time Calculation (min) 44 min   ? Activity Tolerance Patient tolerated treatment well   ? Behavior During Therapy [facility] for tasks assessed/performed   ? ?  ?  ? ?  ? ? ?No past medical history on file. ? ?Past Surgical History:  ?Procedure Laterality Date  ? GASTRIC BYPASS  10/2019  ? HERNIA REPAIR  2022  ? hernia repair and mass removal  ? WRIST FRACTURE SURGERY Left 12/20/2020  ? ? ?There were no vitals filed for this visit. ? ? Subjective Assessment - 02/28/22 0758   ? ? Subjective Pt reports she did have two instances of urinary incontinence at night while asleep and needed to change clothes. Pt is trying to get a squatty potty but tried the 9" and thinks that was too tall, now going to try the 7". Pt reports multiple BMs per day sometimes but no accidents.   ? Pertinent History x3 births 1 c-section 2 vaginal; anal cancer   ? How long can you sit comfortably? no limits   ? How long can you stand comfortably? no limits   ? How long can you walk comfortably? no limits   ? Currently in Pain? No/denies   ? ?  ?  ? ?  ? ? ? ? ? ? ? ? ? ? ? ? ? ? ? ? ? Pelvic Floor Special Questions - 02/28/22 0001   ? ? Pelvic Floor Internal Exam patient identified and patient confirms consent for PT to perform internal soft tissue work and muscle strength and integrity assessment   ? Exam Type Vaginal   ? Sensation WFL   ? Palpation no TTP   ? Tone slightly increased moreso at Rt and anterior quadrants   ? ?  ?  ? ?   ? ? ? ? [place] Adult PT Treatment/Exercise - 02/28/22 0001   ? ?  ? Neuro Re-ed   ? Neuro Re-ed Details  internal pelvic floor: NMRE for increased relaxation with gentle stretching at all quadrants and no TTP per pt, increased tension noted at Rt and anterior quadrants but overall improved since last internal assessment   ?  ? Lumbar Exercises: Stretches  ? Double Knee to Chest Stretch 2 reps;30 seconds   ? ITB Stretch Right;Left;2 reps;30 seconds   ? Piriformis Stretch Right;Left;30 seconds   ? Other Lumbar Stretch Exercise happy baby 2x45s   ? Other Lumbar Stretch Exercise butterfly x45s   ? ?  ?  ? ?  ? ? ? ? ? ? ? ? ? ? PT Education - 02/28/22 0849   ? ? Education Details Pt educated on pelvic relaxation techniques, stretching techniques for home and dilator use as tolerated.   ? Person(s) Educated Patient   ? Methods Explanation;Demonstration;Tactile cues;Verbal cues   ? Comprehension Returned demonstration;Verbalized understanding   ? ?  ?  ? ?  ? ? ? PT Short Term Goals -  01/26/22 1205   ? ?  ? PT SHORT TERM GOAL #1  ? Title pt to be I with HEP   ? Time 4   ? Period Weeks   ? Status New   ? Target Date 02/23/22   ?  ? PT SHORT TERM GOAL #2  ? Title Pt to report ability to tolerate vaginal penetration with no more than 5/10 pain for improved tolerance to vaginal penetration.   ? Time 4   ? Period Weeks   ? Status New   ? Target Date 02/23/22   ? ?  ?  ? ?  ? ? ? ? PT Long Term Goals - 01/26/22 1207   ? ?  ? PT LONG TERM GOAL #1  ? Title pt to be I with advanced HEP   ? Time 3   ? Period Months   ? Status New   ? Target Date 04/25/22   ?  ? PT LONG TERM GOAL #2  ? Title pt to report no more than 2/10 pain with vaginal dilator use for improved tolerance to vaginal penetration.   ? Time 3   ? Period Months   ? Status New   ? Target Date 04/25/22   ?  ? PT LONG TERM GOAL #3  ? Title pt to demonstrate improved ability to contract/relax/bulge/relax pelvic floor at least 75% of the time to demonstrate improved pelvic  floor tone for less pelvic pain.   ? Time 3   ? Period Months   ? Status New   ? Target Date 04/25/22   ?  ? PT LONG TERM GOAL #4  ? Title pt to demonstrate at least 4/5 pelvic floor strength with ability to fully relax post contraction for improved pelvic stability and decreased pain.   ? Time 3   ? Period Months   ? Status New   ? Target Date 04/25/22   ? ?  ?  ? ?  ? ? ? ? ? ? ? ? Plan - 02/28/22 0908   ? ? Clinical Impression Statement Pt presents to clinic reporting she has been doing vaginal dilators 3x per week for 10 mins each time, denied pain with use. Pt session focused on internal vaginal treatment for stretching with noted tightness at Rt and anterior quadrants, no pain per pt and overall improved with decreased tension as previous internal session. Pt's treatment then foucsed on stretching with spine and hip stetching. Pt tolerated well denied additional questions and educated on techniques for continued use of dilators. Pt tolerated well without pain and would benefit from additional PT to improve QOL and safety without pain for vaginal penetration for medical assessments as needed and for intercourse.   ? Personal Factors and Comorbidities Age;Time since onset of injury/illness/exacerbation;Comorbidity 1   ? Comorbidities h/o s3 anal cancer now post treatment and removal.   ? Examination-Activity Limitations Other   vaginal penetration  ? Examination-Participation Restrictions Interpersonal Relationship   ? Stability/Clinical Decision Making Stable/Uncomplicated   ? Rehab Potential Good   ? PT Frequency 1x / week   ? PT Duration Other (comment)   10 weeks  ? PT Treatment/Interventions ADLs/Self Care Home Management;Aquatic Therapy;Patient/family education;Therapeutic activities;Functional mobility training;Therapeutic exercise;Neuromuscular re-education;Manual techniques;Taping;Scar mobilization;Passive range of motion;Energy conservation   ? PT Next Visit Plan , manual work at hips, spine and  pelvis, stretching/relaxation   ? PT Home Exercise Plan [id number]   ? Consulted and Agree with Plan of Care Patient   ? ?  ?  ? ?  ? ? ?  Patient will benefit from skilled therapeutic intervention in order to improve the following deficits and impairments:  Decreased coordination, Decreased endurance, Pain, Impaired tone, Increased fascial restricitons, Decreased scar mobility, Impaired flexibility, Improper body mechanics, Postural dysfunction, Decreased strength, Decreased mobility ? ?Visit Diagnosis: ?Muscle weakness (generalized) ? ?Unspecified lack of coordination ? ?Other muscle spasm ? ? ? ? ?Problem List ?Patient Active Problem List  ? Diagnosis Date Noted  ? Anal cancer ([place]) 09/20/2021  ? ? ?Stacy Gardner, PT, DPT ?03/08/239:12 AM ? ? ?[facility] ?[facility] [place] ?[place], [place], [address] ?Phone: [phone]   Fax:  [phone] ? ?Name: Hydee Fleece ?MRN: [id number] ?Date of Birth: 1961-09-20 ? ? ? ?

## 2022-03-07 ENCOUNTER — Ambulatory Visit: Payer: 59 | Admitting: Physical Therapy

## 2022-03-07 ENCOUNTER — Encounter: Payer: Self-pay | Admitting: Physical Therapy

## 2022-03-07 ENCOUNTER — Other Ambulatory Visit: Payer: Self-pay

## 2022-03-07 DIAGNOSIS — R279 Unspecified lack of coordination: Secondary | ICD-10-CM

## 2022-03-07 DIAGNOSIS — M6281 Muscle weakness (generalized): Secondary | ICD-10-CM

## 2022-03-07 DIAGNOSIS — M62838 Other muscle spasm: Secondary | ICD-10-CM | POA: Diagnosis not present

## 2022-03-07 NOTE — Patient Instructions (Signed)

## 2022-03-07 NOTE — Therapy (Signed)
[facility] ?[facility] [place] ?[place], [place], [address] ?Phone: [phone]   Fax:  [phone] ? ?Physical Therapy Treatment ? ?Patient Details  ?Name: Barbara Meyer ?MRN: [id number] ?Date of Birth: [birth year] ?Referring Provider (PT): Leighton Ruff, MD ? ? ?Encounter Date: 03/07/2022 ? ? PT End of Session - 03/07/22 1018   ? ? Visit Number 4   ? Date for PT Re-Evaluation 04/25/22   ? Authorization Type Aetna   ? PT Start Time 1015   ? PT Stop Time 1055   ? PT Time Calculation (min) 40 min   ? Activity Tolerance Patient tolerated treatment well   ? Behavior During Therapy [facility] for tasks assessed/performed   ? ?  ?  ? ?  ? ? ?History reviewed. No pertinent past medical history. ? ?Past Surgical History:  ?Procedure Laterality Date  ? GASTRIC BYPASS  10/2019  ? HERNIA REPAIR  2022  ? hernia repair and mass removal  ? WRIST FRACTURE SURGERY Left 12/20/2020  ? ? ?There were no vitals filed for this visit. ? ? Subjective Assessment - 03/07/22 1018   ? ? Subjective Pt reports she has been having increased BM frequency the past week and giong 3-5x per day. Pt reports today she has had 4 BMs this morning and all type 6-7, usually more type 5-4. Pt reports she was "struggling with my mental health" this week and that she tends to eat junk food when this happens, so this past week diet has been off. Pt   ? Pertinent History x3 births 1 c-section 2 vaginal; anal cancer   ? How long can you sit comfortably? no limits   ? How long can you stand comfortably? no limits   ? How long can you walk comfortably? no limits   ? ?  ?  ? ?  ? ? ? ? ? ? ? ? ? ? ? ? ? ? ? ? ? ? ? ? [place] Adult PT Treatment/Exercise - 03/07/22 0001   ? ?  ? Exercises  ? Exercises Lumbar;Knee/Hip   ?  ? Lumbar Exercises: Standing  ? Functional Squats 20 reps   ? Forward Lunge 10 reps   ? Other Standing Lumbar Exercises palloff green band x10 each   ?  ? Lumbar Exercises: Supine  ? Clam 20 reps   ? Clam  Limitations black loop   ? Other Supine Lumbar Exercises hip flexion in hooklying black loop x20   ?  ? Lumbar Exercises: Quadruped  ? Opposite Arm/Leg Raise Right arm/Left leg;Left arm/Right leg;10 reps   ? Other Quadruped Lumbar Exercises fire hydrants x10 each; donkey kicks x10 black loop   ? ?  ?  ? ?  ? ? ? ? ? ? ? ? ? ? PT Education - 03/07/22 1056   ? ? Education Details Pt educated on hip/core strengthening with coordination of breathing and pelvic floor   ? Person(s) Educated Patient   ? Methods Explanation;Demonstration;Tactile cues;Verbal cues;Handout   ? Comprehension Verbalized understanding;Returned demonstration   ? ?  ?  ? ?  ? ? ? PT Short Term Goals - 01/26/22 1205   ? ?  ? PT SHORT TERM GOAL #1  ? Title pt to be I with HEP   ? Time 4   ? Period Weeks   ? Status New   ? Target Date 02/23/22   ?  ? PT SHORT TERM GOAL #2  ? Title Pt  to report ability to tolerate vaginal penetration with no more than 5/10 pain for improved tolerance to vaginal penetration.   ? Time 4   ? Period Weeks   ? Status New   ? Target Date 02/23/22   ? ?  ?  ? ?  ? ? ? ? PT Long Term Goals - 01/26/22 1207   ? ?  ? PT LONG TERM GOAL #1  ? Title pt to be I with advanced HEP   ? Time 3   ? Period Months   ? Status New   ? Target Date 04/25/22   ?  ? PT LONG TERM GOAL #2  ? Title pt to report no more than 2/10 pain with vaginal dilator use for improved tolerance to vaginal penetration.   ? Time 3   ? Period Months   ? Status New   ? Target Date 04/25/22   ?  ? PT LONG TERM GOAL #3  ? Title pt to demonstrate improved ability to contract/relax/bulge/relax pelvic floor at least 75% of the time to demonstrate improved pelvic floor tone for less pelvic pain.   ? Time 3   ? Period Months   ? Status New   ? Target Date 04/25/22   ?  ? PT LONG TERM GOAL #4  ? Title pt to demonstrate at least 4/5 pelvic floor strength with ability to fully relax post contraction for improved pelvic stability and decreased pain.   ? Time 3   ? Period Months    ? Status New   ? Target Date 04/25/22   ? ?  ?  ? ?  ? ? ? ? ? ? ? ? Plan - 03/07/22 1056   ? ? Clinical Impression Statement Pt presents to clinic reporting she was "struggling with my mental health this past week" and does see a counselor for this but notes she does tend to eat "junk food" when having these weeks and does report she had an increase in BMs this past week and more liquid. Pt reports she has been attempting with squatty potty and she doesn't have to strain but is still having 3-5x BMs per day. Pt session focused on hip and core strengthening with coordination of pelvic floor and breathing with exercises, pt tolerated well with minimal cues and brief rest breaks. Pt would benefit from additional PT to further address deficits.   ? Personal Factors and Comorbidities Age;Time since onset of injury/illness/exacerbation;Comorbidity 1   ? Comorbidities h/o s3 anal cancer now post treatment and removal.   ? Examination-Activity Limitations Other   vaginal penetration  ? Examination-Participation Restrictions Interpersonal Relationship   ? Stability/Clinical Decision Making Stable/Uncomplicated   ? Rehab Potential Good   ? PT Frequency 1x / week   ? PT Duration Other (comment)   10 weeks  ? PT Treatment/Interventions ADLs/Self Care Home Management;Aquatic Therapy;Patient/family education;Therapeutic activities;Functional mobility training;Therapeutic exercise;Neuromuscular re-education;Manual techniques;Taping;Scar mobilization;Passive range of motion;Energy conservation   ? PT Next Visit Plan , manual work at hips, spine and pelvis, stretching/relaxation   ? PT Home Exercise Plan [email or web address]   ? Consulted and Agree with Plan of Care Patient   ? ?  ?  ? ?  ? ? ?Patient will benefit from skilled therapeutic intervention in order to improve the following deficits and impairments:  Decreased coordination, Decreased endurance, Pain, Impaired tone, Increased fascial restricitons, Decreased scar mobility, Impaired  flexibility, Improper body mechanics, Postural dysfunction, Decreased strength, Decreased mobility ? ?Visit Diagnosis: ?Other muscle  spasm ? ?Unspecified lack of coordination ? ?Muscle weakness (generalized) ? ? ? ? ?Problem List ?Patient Active Problem List  ? Diagnosis Date Noted  ? Anal cancer ([place]) 09/20/2021  ? ? ?Stacy Gardner, PT, DPT ?03/07/2310:12 AM ? ? ?[facility] [place] ?[facility] [place] ?[place], [place], [address] ?Phone: [phone]   Fax:  [phone] ? ?Name: Barbara Meyer ?MRN: [id number] ?Date of Birth: Apr 28, 1961 ? ? ? ?

## 2022-03-12 DIAGNOSIS — G4700 Insomnia, unspecified: Secondary | ICD-10-CM | POA: Diagnosis not present

## 2022-03-12 DIAGNOSIS — R69 Illness, unspecified: Secondary | ICD-10-CM | POA: Diagnosis not present

## 2022-03-12 DIAGNOSIS — Z79899 Other long term (current) drug therapy: Secondary | ICD-10-CM | POA: Diagnosis not present

## 2022-03-12 DIAGNOSIS — F419 Anxiety disorder, unspecified: Secondary | ICD-10-CM | POA: Diagnosis not present

## 2022-03-12 DIAGNOSIS — F4312 Post-traumatic stress disorder, chronic: Secondary | ICD-10-CM | POA: Diagnosis not present

## 2022-03-14 ENCOUNTER — Other Ambulatory Visit: Payer: Self-pay

## 2022-03-14 ENCOUNTER — Encounter: Payer: Self-pay | Admitting: Physical Therapy

## 2022-03-14 ENCOUNTER — Ambulatory Visit: Payer: 59 | Admitting: Physical Therapy

## 2022-03-14 DIAGNOSIS — M6281 Muscle weakness (generalized): Secondary | ICD-10-CM

## 2022-03-14 DIAGNOSIS — M62838 Other muscle spasm: Secondary | ICD-10-CM | POA: Diagnosis not present

## 2022-03-14 DIAGNOSIS — R279 Unspecified lack of coordination: Secondary | ICD-10-CM | POA: Diagnosis not present

## 2022-03-14 NOTE — Therapy (Signed)
[place] ?[facility] [place] ?[place], [place], [address] ?Phone: [phone]   Fax:  [phone] ? ?Physical Therapy Treatment ? ?Patient Details  ?Name: Barbara Meyer ?MRN: [id number] ?Date of Birth: 02/26/1961 ?Referring Provider (PT): Leighton Ruff, MD ? ? ?Encounter Date: 03/14/2022 ? ? PT End of Session - 03/14/22 0840   ? ? Visit Number 5   ? Date for PT Re-Evaluation 04/25/22   ? Authorization Type Aetna   ? Authorization - Number of Visits 35   ? PT Start Time 0845   ? PT Stop Time [phone]   ? PT Time Calculation (min) 43 min   ? Activity Tolerance Patient tolerated treatment well   ? Behavior During Therapy [facility] for tasks assessed/performed   ? ?  ?  ? ?  ? ? ?History reviewed. No pertinent past medical history. ? ?Past Surgical History:  ?Procedure Laterality Date  ? GASTRIC BYPASS  10/2019  ? HERNIA REPAIR  2022  ? hernia repair and mass removal  ? WRIST FRACTURE SURGERY Left 12/20/2020  ? ? ?There were no vitals filed for this visit. ? ? Subjective Assessment - 03/14/22 0841   ? ? Subjective Pt reports soreness at bil quads post last session, had spicy foods last week and had diarrhea for 3 days after this. Pt also reports this has shown her how weak she has gotten and motivated to work out more and has started to go to a workout class with her sister 2x per week now. Pt is usually having 2-3 Bms per day, and sometimes still feels that she is not fully emptying.   ? Pertinent History x3 births 1 c-section 2 vaginal; anal cancer   ? How long can you sit comfortably? no limits   ? How long can you stand comfortably? no limits   ? How long can you walk comfortably? no limits   ? Currently in Pain? No/denies   ? ?  ?  ? ?  ? ? ? ? ? ? ? ? ? ? ? ? ? ? ? ? ? ? ? ? [facility] Adult PT Treatment/Exercise - 03/14/22 0001   ? ?  ? Neuro Re-ed   ? Neuro Re-ed Details  diaphragmatic breathing x5 and x5 straw/balloon breathing in sitting with feet at squatty potty for  improved voiding mechanics as pt reported she sometimes feels that she isn't fully emptying or needs to strain at bit sometimes.   ?  ? Exercises  ? Exercises Lumbar;Knee/Hip   ?  ? Lumbar Exercises: Stretches  ? ITB Stretch Right;Left;30 seconds   ? Other Lumbar Stretch Exercise happy baby 2x30s; childs pose x30s, side bending childs x30s each; needle threaders x30s each   ? Other Lumbar Stretch Exercise foam roller at thoracic spine for extension 2x30s, thoracic extension with foam roller open books x10   ?  ? Lumbar Exercises: Standing  ? Functional Squats 20 reps   ?  ? Lumbar Exercises: Quadruped  ? [place]cat/Old Horse 10 reps   ? Other Quadruped Lumbar Exercises modifed high table quad bird dogs 2x10   ? Other Quadruped Lumbar Exercises modifed quad on high table 2x10 shoulder taps   ? ?  ?  ? ?  ? ? ? ? ? ? ? ? ? ? PT Education - 03/14/22 0932   ? ? Education Details Continued voiding mechanics, pelvic relaxation and coordination of breathing with exercises   ? Person(s) Educated Patient   ?  Methods Explanation;Demonstration;Tactile cues;Verbal cues;Handout   ? Comprehension Returned demonstration;Verbalized understanding   ? ?  ?  ? ?  ? ? ? PT Short Term Goals - 01/26/22 1205   ? ?  ? PT SHORT TERM GOAL #1  ? Title pt to be I with HEP   ? Time 4   ? Period Weeks   ? Status New   ? Target Date 02/23/22   ?  ? PT SHORT TERM GOAL #2  ? Title Pt to report ability to tolerate vaginal penetration with no more than 5/10 pain for improved tolerance to vaginal penetration.   ? Time 4   ? Period Weeks   ? Status New   ? Target Date 02/23/22   ? ?  ?  ? ?  ? ? ? ? PT Long Term Goals - 01/26/22 1207   ? ?  ? PT LONG TERM GOAL #1  ? Title pt to be I with advanced HEP   ? Time 3   ? Period Months   ? Status New   ? Target Date 04/25/22   ?  ? PT LONG TERM GOAL #2  ? Title pt to report no more than 2/10 pain with vaginal dilator use for improved tolerance to vaginal penetration.   ? Time 3   ? Period Months   ? Status New    ? Target Date 04/25/22   ?  ? PT LONG TERM GOAL #3  ? Title pt to demonstrate improved ability to contract/relax/bulge/relax pelvic floor at least 75% of the time to demonstrate improved pelvic floor tone for less pelvic pain.   ? Time 3   ? Period Months   ? Status New   ? Target Date 04/25/22   ?  ? PT LONG TERM GOAL #4  ? Title pt to demonstrate at least 4/5 pelvic floor strength with ability to fully relax post contraction for improved pelvic stability and decreased pain.   ? Time 3   ? Period Months   ? Status New   ? Target Date 04/25/22   ? ?  ?  ? ?  ? ? ? ? ? ? ? ? Plan - 03/14/22 0933   ? ? Clinical Impression Statement Pt presents to clinc reporting she has not had any fecal leakage, did have an instance of diarrhea after eating spicy foods but this improved, is still having 2-3 Bms per day and intermittent need to strain or feely of imcomplete evacuation. Pt demonstrated use of squatty potty and given techniques to improve pelvic relaxation while at toilet for voiding, reps for daphragmatic breathing and balloon breathing for this, pt demonstrated good abilty to complete and reports shell try this. Pt session then focused on hip and core strengthening with cooridnation of pelvic floor and breathing for all exercises for improved mobility of pelvic floor with all tasks. Pt does continue to do HEP and abdominal massage and use of squatty potty. Pt would benefit from additional PT for further addressing of defcits to improve QOL.   ? Personal Factors and Comorbidities Age;Time since onset of injury/illness/exacerbation;Comorbidity 1   ? Comorbidities h/o s3 anal cancer now post treatment and removal.   ? Examination-Activity Limitations Other   vaginal penetration  ? Examination-Participation Restrictions Interpersonal Relationship   ? Stability/Clinical Decision Making Stable/Uncomplicated   ? Rehab Potential Good   ? PT Frequency 1x / week   ? PT Duration Other (comment)   10 weeks  ? PT  Treatment/Interventions ADLs/Self Care Home Management;Aquatic Therapy;Patient/family education;Therapeutic activities;Functional mobility training;Therapeutic exercise;Neuromuscular re-education;Manual techniques;Taping;Scar mobilization;Passive range of motion;Energy conservation   ? PT Next Visit Plan internal rectal assessment if pt agreeable   ? PT Home Exercise Plan [email or web address]   ? Consulted and Agree with Plan of Care Patient   ? ?  ?  ? ?  ? ? ?Patient will benefit from skilled therapeutic intervention in order to improve the following deficits and impairments:  Decreased coordination, Decreased endurance, Pain, Impaired tone, Increased fascial restricitons, Decreased scar mobility, Impaired flexibility, Improper body mechanics, Postural dysfunction, Decreased strength, Decreased mobility ? ?Visit Diagnosis: ?Muscle weakness (generalized) ? ?Unspecified lack of coordination ? ?Other muscle spasm ? ? ? ? ?Problem List ?Patient Active Problem List  ? Diagnosis Date Noted  ? Anal cancer ([place]) 09/20/2021  ? ? ?Stacy Gardner, PT, DPT ?03/22/239:37 AM  ? ? ?[place] ?[facility] [place] ?[place], [place], [address] ?Phone: [phone]   Fax:  [phone] ? ?Name: Ileta Ofarrell ?MRN: [id number] ?Date of Birth: 31-Mar-1961 ? ? ? ?

## 2022-03-20 ENCOUNTER — Ambulatory Visit (HOSPITAL_COMMUNITY): Payer: 59

## 2022-03-20 ENCOUNTER — Inpatient Hospital Stay (HOSPITAL_COMMUNITY): Payer: 59 | Attending: Hematology

## 2022-03-20 ENCOUNTER — Encounter (HOSPITAL_COMMUNITY): Payer: Self-pay

## 2022-03-21 ENCOUNTER — Ambulatory Visit: Payer: 59 | Admitting: Physical Therapy

## 2022-03-21 ENCOUNTER — Other Ambulatory Visit: Payer: Self-pay

## 2022-03-21 DIAGNOSIS — M62838 Other muscle spasm: Secondary | ICD-10-CM | POA: Diagnosis not present

## 2022-03-21 DIAGNOSIS — R279 Unspecified lack of coordination: Secondary | ICD-10-CM | POA: Diagnosis not present

## 2022-03-21 DIAGNOSIS — M6281 Muscle weakness (generalized): Secondary | ICD-10-CM | POA: Diagnosis not present

## 2022-03-21 NOTE — Therapy (Addendum)
[facility] @ [facility] [place], [place], [address] Phone: [phone]   Fax:  [phone]  Physical Therapy Treatment  Patient Details  Name: Barbara Meyer MRN: [id number] Date of Birth: 31-Jul-1961 Referring Provider (PT): Leighton Ruff, MD   Encounter Date: 03/21/2022   PT End of Session - 03/21/22 1018     Visit Number 6    Date for PT Re-Evaluation 04/25/22    Authorization Type [employer]    Authorization - Number of Visits 35    PT Start Time 1016    PT Stop Time 1058    PT Time Calculation (min) 42 min    Activity Tolerance Patient tolerated treatment well    Behavior During Therapy [facility] for tasks assessed/performed             No past medical history on file.  Past Surgical History:  Procedure Laterality Date   GASTRIC BYPASS  10/2019   HERNIA REPAIR  2022   hernia repair and mass removal   WRIST FRACTURE SURGERY Left 12/20/2020    There were no vitals filed for this visit.                   Pelvic Floor Special Questions - 03/21/22 0001     Pelvic Floor Internal Exam patient identified and patient confirms consent for PT to perform internal soft tissue work and muscle strength and integrity assessment    Exam Type Rectal    Sensation WFL    Palpation mild TTP with initial insertion of gloved digit but relieved quickly.    Strength fair squeeze, definite lift    Strength # of reps 6    Strength # of seconds 8    Tone slightly increased tone at EAS and cues for breathing mechanics and visualization for relaxation and ability to bulge               [facility] Adult PT Treatment/Exercise - 03/21/22 0001       Neuro Re-ed    Neuro Re-ed Details  pelvic relaxation in sitting at ball for pelvic tilts and circles x10 each and cues for visualization for relaxation. Also lateral pelvic tilts x10 with diaphragmatic breathing for pelvic relaxation at towel roll with pt reported this felt  better to her and able to feel tissue release.      Exercises   Exercises Lumbar;Knee/Hip      Manual Therapy   Manual Therapy Internal Pelvic Floor    Manual therapy comments rectal internal treatment provided by PT with pt consent, gentle manual work at all directions at EAS and puborectalis for improved tissue mobility and relaxation with good effect, pt able to relax and demonstrate improved bulge with diaphragmatic breathing and minimal reps.-- Also externally with pt in quad rocking manual work at coccyx to improve mobility with tissue release in posterior pelvic floor and at coccyx with good effect and pt reports she didn't feel as tight after.                       PT Short Term Goals - 01/26/22 1205       PT SHORT TERM GOAL #1   Title pt to be I with HEP    Time 4    Period Weeks    Status New    Target Date 02/23/22      PT SHORT TERM GOAL #2   Title Pt to report ability to  tolerate vaginal penetration with no more than 5/10 pain for improved tolerance to vaginal penetration.    Time 4    Period Weeks    Status New    Target Date 02/23/22               PT Long Term Goals - 01/26/22 1207       PT LONG TERM GOAL #1   Title pt to be I with advanced HEP    Time 3    Period Months    Status New    Target Date 04/25/22      PT LONG TERM GOAL #2   Title pt to report no more than 2/10 pain with vaginal dilator use for improved tolerance to vaginal penetration.    Time 3    Period Months    Status New    Target Date 04/25/22      PT LONG TERM GOAL #3   Title pt to demonstrate improved ability to contract/relax/bulge/relax pelvic floor at least 75% of the time to demonstrate improved pelvic floor tone for less pelvic pain.    Time 3    Period Months    Status New    Target Date 04/25/22      PT LONG TERM GOAL #4   Title pt to demonstrate at least 4/5 pelvic floor strength with ability to fully relax post contraction for improved pelvic  stability and decreased pain.    Time 3    Period Months    Status New    Target Date 04/25/22                   Plan - 03/21/22 1101     Clinical Impression Statement Pt presents to clinic reporting continued improvement with only one small liquid leakage episode since last appointment and feels she is having instances of sometimes able to fully evacuate with BMs without straining but other times still not fully empty. Pt consented to internal rectal assessment this session, mild tightness noted wiht manual applied and good effect felt here for relaxation, external manual work at coccyx also completed with good effect and pelvic relaxation and diaphgramtic breathing directed in various positions to improve tissue mobility and relaxation. Pt tolerated well, HEP updated and went over with pt. Pt reports she is started at new part time job and will call to schedule more appointments as she knows her schedule.  Pt would benefit from additional PT for further addressing of defcits to improve QOL.    Personal Factors and Comorbidities Age;Time since onset of injury/illness/exacerbation;Comorbidity 1    Comorbidities h/o s3 anal cancer now post treatment and removal.    Examination-Activity Limitations Other   vaginal penetration   Examination-Participation Restrictions Interpersonal Relationship    Stability/Clinical Decision Making Stable/Uncomplicated    Rehab Potential Good    PT Frequency 1x / week    PT Duration Other (comment)   10 weeks   PT Treatment/Interventions ADLs/Self Care Home Management;Aquatic Therapy;Patient/family education;Therapeutic activities;Functional mobility training;Therapeutic exercise;Neuromuscular re-education;Manual techniques;Taping;Scar mobilization;Passive range of motion;Energy conservation    PT Next Visit Plan pelvic relaxation    PT Home Exercise Plan [id number]    Consulted and Agree with Plan of Care Patient             Patient will benefit  from skilled therapeutic intervention in order to improve the following deficits and impairments:  Decreased coordination, Decreased endurance, Pain, Impaired tone, Increased fascial restricitons, Decreased scar mobility, Impaired flexibility, Improper  body mechanics, Postural dysfunction, Decreased strength, Decreased mobility  Visit Diagnosis: Unspecified lack of coordination  Other muscle spasm     Problem List Patient Active Problem List   Diagnosis Date Noted   Anal cancer ([place]) 09/20/2021   No emotional/communication barriers or cognitive limitation. Patient is motivated to learn. Patient understands and agrees with treatment goals and plan. PT explains patient will be examined in standing, sitting, and lying down to see how their muscles and joints work. When they are ready, they will be asked to remove their underwear so PT can examine their perineum. The patient is also given the option of providing their own chaperone as one is not provided in our facility. The patient also has the right and is explained the right to defer or refuse any part of the evaluation or treatment including the internal exam. With the patient's consent, PT will use one gloved finger to gently assess the muscles of the pelvic floor, seeing how well it contracts and relaxes and if there is muscle symmetry. After, the patient will get dressed and PT and patient will discuss exam findings and plan of care. PT and patient discuss plan of care, schedule, attendance policy and HEP activities.   Stacy Gardner, PT, DPT 03/21/2310:05 AM    PHYSICAL THERAPY DISCHARGE SUMMARY  Visits from Start of Care: 6  Current functional level related to goals / functional outcomes: Pt had not scheduled any additional appointments since last visit and had not returned   Remaining deficits: Unknown, unable to formally reassess     Education / Equipment: HEP   Patient agrees to discharge. Patient goals were partially met.  Patient is being discharged due to not returning since the last visit.   Stacy Gardner, PT, DPT 05/23/233:21 PM   [place] [facility] @ [facility] [place], [place], [address] Phone: [phone]   Fax:  [phone]  Name: Cathline Dowen MRN: [id number] Date of Birth: 1961-04-25

## 2022-03-21 NOTE — Patient Instructions (Signed)

## 2022-03-26 ENCOUNTER — Ambulatory Visit (HOSPITAL_COMMUNITY): Payer: 59 | Admitting: Hematology

## 2022-04-09 ENCOUNTER — Inpatient Hospital Stay (HOSPITAL_COMMUNITY): Payer: 59

## 2022-04-09 ENCOUNTER — Inpatient Hospital Stay (HOSPITAL_COMMUNITY): Payer: 59 | Attending: Hematology

## 2022-04-09 DIAGNOSIS — Z85048 Personal history of other malignant neoplasm of rectum, rectosigmoid junction, and anus: Secondary | ICD-10-CM | POA: Insufficient documentation

## 2022-04-09 DIAGNOSIS — Z23 Encounter for immunization: Secondary | ICD-10-CM

## 2022-04-09 DIAGNOSIS — C210 Malignant neoplasm of anus, unspecified: Secondary | ICD-10-CM

## 2022-04-09 LAB — CBC WITH DIFFERENTIAL/PLATELET
Abs Immature Granulocytes: 0.01 10*3/uL (ref 0.00–0.07)
Basophils Absolute: 0 10*3/uL (ref 0.0–0.1)
Basophils Relative: 1 %
Eosinophils Absolute: 0.2 10*3/uL (ref 0.0–0.5)
Eosinophils Relative: 4 %
HCT: 37.5 % (ref 36.0–46.0)
Hemoglobin: 12.5 g/dL (ref 12.0–15.0)
Immature Granulocytes: 0 %
Lymphocytes Relative: 23 %
Lymphs Abs: 1.1 10*3/uL (ref 0.7–4.0)
MCH: 30.3 pg (ref 26.0–34.0)
MCHC: 33.3 g/dL (ref 30.0–36.0)
MCV: 90.8 fL (ref 80.0–100.0)
Monocytes Absolute: 0.5 10*3/uL (ref 0.1–1.0)
Monocytes Relative: 10 %
Neutro Abs: 3 10*3/uL (ref 1.7–7.7)
Neutrophils Relative %: 62 %
Platelets: 166 10*3/uL (ref 150–400)
RBC: 4.13 MIL/uL (ref 3.87–5.11)
RDW: 13.3 % (ref 11.5–15.5)
WBC: 4.7 10*3/uL (ref 4.0–10.5)
nRBC: 0 % (ref 0.0–0.2)

## 2022-04-09 LAB — COMPREHENSIVE METABOLIC PANEL
ALT: 46 U/L — ABNORMAL HIGH (ref 0–44)
AST: 43 U/L — ABNORMAL HIGH (ref 15–41)
Albumin: 4 g/dL (ref 3.5–5.0)
Alkaline Phosphatase: 67 U/L (ref 38–126)
Anion gap: 6 (ref 5–15)
BUN: 23 mg/dL — ABNORMAL HIGH (ref 6–20)
CO2: 25 mmol/L (ref 22–32)
Calcium: 8.6 mg/dL — ABNORMAL LOW (ref 8.9–10.3)
Chloride: 108 mmol/L (ref 98–111)
Creatinine, Ser: 0.59 mg/dL (ref 0.44–1.00)
GFR, Estimated: >60 mL/min (ref >60)
Glucose, Bld: 82 mg/dL (ref 70–99)
Potassium: 3.9 mmol/L (ref 3.5–5.1)
Sodium: 139 mmol/L (ref 135–145)
Total Bilirubin: 0.3 mg/dL (ref 0.3–1.2)
Total Protein: 6.7 g/dL (ref 6.5–8.1)

## 2022-04-09 LAB — IRON AND TIBC
Iron: 73 ug/dL (ref 28–170)
Saturation Ratios: 19 % (ref 10.4–31.8)
TIBC: 390 ug/dL (ref 250–450)
UIBC: 317 ug/dL

## 2022-04-09 LAB — FOLATE: Folate: 46.6 ng/mL (ref >5.9)

## 2022-04-09 LAB — VITAMIN B12: Vitamin B-12: 835 pg/mL (ref 180–914)

## 2022-04-09 LAB — LACTATE DEHYDROGENASE: LDH: 199 U/L — ABNORMAL HIGH (ref 98–192)

## 2022-04-09 LAB — FERRITIN: Ferritin: 39 ng/mL (ref 11–307)

## 2022-04-12 DIAGNOSIS — F4312 Post-traumatic stress disorder, chronic: Secondary | ICD-10-CM | POA: Diagnosis not present

## 2022-04-12 DIAGNOSIS — G4700 Insomnia, unspecified: Secondary | ICD-10-CM | POA: Diagnosis not present

## 2022-04-12 DIAGNOSIS — R69 Illness, unspecified: Secondary | ICD-10-CM | POA: Diagnosis not present

## 2022-04-12 DIAGNOSIS — Z79899 Other long term (current) drug therapy: Secondary | ICD-10-CM | POA: Diagnosis not present

## 2022-04-12 DIAGNOSIS — F419 Anxiety disorder, unspecified: Secondary | ICD-10-CM | POA: Diagnosis not present

## 2022-04-18 ENCOUNTER — Ambulatory Visit
Admission: RE | Admit: 2022-04-18 | Discharge: 2022-04-18 | Disposition: A | Discharge status: Home / Self Care | Payer: 59 | Source: Ambulatory Visit | Attending: Hematology | Admitting: Hematology

## 2022-04-18 DIAGNOSIS — K838 Other specified diseases of biliary tract: Secondary | ICD-10-CM | POA: Diagnosis not present

## 2022-04-18 DIAGNOSIS — C210 Malignant neoplasm of anus, unspecified: Secondary | ICD-10-CM

## 2022-04-18 DIAGNOSIS — Z85048 Personal history of other malignant neoplasm of rectum, rectosigmoid junction, and anus: Secondary | ICD-10-CM | POA: Diagnosis not present

## 2022-04-18 DIAGNOSIS — K7689 Other specified diseases of liver: Secondary | ICD-10-CM | POA: Diagnosis not present

## 2022-04-18 DIAGNOSIS — K6289 Other specified diseases of anus and rectum: Secondary | ICD-10-CM | POA: Diagnosis not present

## 2022-04-18 MED ORDER — IOPAMIDOL (ISOVUE-370) INJECTION 76%
80.0000 mL | Freq: Once | INTRAVENOUS | Status: AC | PRN
  Administered 2022-04-18: 80 mL via INTRAVENOUS

## 2022-05-07 DIAGNOSIS — F4312 Post-traumatic stress disorder, chronic: Secondary | ICD-10-CM | POA: Diagnosis not present

## 2022-05-07 DIAGNOSIS — G4700 Insomnia, unspecified: Secondary | ICD-10-CM | POA: Diagnosis not present

## 2022-05-07 DIAGNOSIS — R69 Illness, unspecified: Secondary | ICD-10-CM | POA: Diagnosis not present

## 2022-05-07 DIAGNOSIS — F419 Anxiety disorder, unspecified: Secondary | ICD-10-CM | POA: Diagnosis not present

## 2022-05-07 DIAGNOSIS — Z79899 Other long term (current) drug therapy: Secondary | ICD-10-CM | POA: Diagnosis not present

## 2022-05-08 ENCOUNTER — Inpatient Hospital Stay (HOSPITAL_COMMUNITY): Payer: 59 | Attending: Hematology | Admitting: Hematology

## 2022-05-08 VITALS — BP 116/70 | HR 75 | Temp 98.5°F | Resp 18 | Ht 66.0 in | Wt 174.5 lb

## 2022-05-08 DIAGNOSIS — Z85048 Personal history of other malignant neoplasm of rectum, rectosigmoid junction, and anus: Secondary | ICD-10-CM | POA: Insufficient documentation

## 2022-05-08 DIAGNOSIS — Z808 Family history of malignant neoplasm of other organs or systems: Secondary | ICD-10-CM | POA: Insufficient documentation

## 2022-05-08 DIAGNOSIS — Z800 Family history of malignant neoplasm of digestive organs: Secondary | ICD-10-CM | POA: Insufficient documentation

## 2022-05-08 DIAGNOSIS — C210 Malignant neoplasm of anus, unspecified: Secondary | ICD-10-CM

## 2022-05-08 DIAGNOSIS — Z9884 Bariatric surgery status: Secondary | ICD-10-CM | POA: Diagnosis not present

## 2022-05-08 DIAGNOSIS — Z923 Personal history of irradiation: Secondary | ICD-10-CM | POA: Insufficient documentation

## 2022-05-08 DIAGNOSIS — Z87891 Personal history of nicotine dependence: Secondary | ICD-10-CM | POA: Insufficient documentation

## 2022-05-08 NOTE — Progress Notes (Signed)
? ?[facility] ?[address] ?[place], [place] [address] ? ? ?CLINIC:  ?Medical Oncology/Hematology ? ?PCP:  ?Drue Flirt, MD ?[phone] [address] [place] [address] ?[phone] ? ? ?REASON FOR VISIT:  ?Follow-up for anal cancer ? ?PRIOR THERAPY: XRT completed December 2021 ? ?NGS Results: not done ? ?CURRENT THERAPY: surveillance ? ?BRIEF ONCOLOGIC HISTORY:  ?Oncology History  ?Anal cancer ([place])  ?09/20/2021 Initial Diagnosis  ? Anal cancer ([place]) ? ?  ?09/20/2021 Cancer Staging  ? Staging form: Anus, AJCC 8th Edition ?- Clinical stage from 09/20/2021: Stage IIIA (ycT2, cN1a, cM0) - Signed by Derek Jack, MD on 09/20/2021 ?Stage prefix: Post-therapy ?Histologic grade (G): G3 ?Histologic grading system: 4 grade system ?P16 overexpression: Positive ? ?  ? ? ?CANCER STAGING: ?Cancer Staging  ?Anal cancer ([place]) ?Staging form: Anus, AJCC 8th Edition ?- Clinical stage from 09/20/2021: Stage IIIA (ycT2, cN1a, cM0) - Signed by Derek Jack, MD on 09/20/2021 ? ? ?INTERVAL HISTORY:  ?[name], a 61 y.o. female, returns for routine follow-up of her anal cancer. Killian was last seen on 11/28/2021.  ? ?Today she reports feeling good. She reports headaches. She denies abdominal pain and new pains. She reports occasional mild rectal bleeding.  ? ?REVIEW OF SYSTEMS:  ?Review of Systems  ?Constitutional:  Negative for appetite change and fatigue.  ?Gastrointestinal:  Positive for blood in stool (mild) and diarrhea. Negative for abdominal pain.  ?Neurological:  Positive for dizziness and headaches.  ?Psychiatric/Behavioral:  Positive for depression. The patient is nervous/anxious.   ?All other systems reviewed and are negative. ? ?PAST MEDICAL/SURGICAL HISTORY:  ?No past medical history on file. ?Past Surgical History:  ?Procedure Laterality Date  ? GASTRIC BYPASS  10/2019  ? HERNIA REPAIR  2022  ? hernia repair and mass removal  ? WRIST FRACTURE SURGERY Left 12/20/2020  ? ? ?SOCIAL  HISTORY:  ?Social History  ? ?Socioeconomic History  ? Marital status: Single  ?  Spouse name: Not on file  ? Number of children: Not on file  ? Years of education: Not on file  ? Highest education level: Not on file  ?Occupational History  ? Not on file  ?Tobacco Use  ? Smoking status: Former  ?  Types: Cigarettes  ?  Quit date: 2002  ?  Years since quitting: 21.3  ? Smokeless tobacco: Never  ?Vaping Use  ? Vaping Use: Never used  ?Substance and Sexual Activity  ? 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 Drug use: Never  ? Sexual activity: Not Currently  ?Other Topics Concern  ? Not on file  ?Social History Narrative  ? Not on file  ? ?Social Determinants of Health  ? ?Financial Resource Strain: Low Risk   ? Difficulty of Paying Living Expenses: Not hard at all  ?Food Insecurity: No Food Insecurity  ? Worried About Charity fundraiser in the Last Year: Never true  ? Ran Out of Food in the Last Year: Never true  ?Transportation Needs: No Transportation Needs  ? Lack of Transportation (Medical): No  ? Lack of Transportation (Non-Medical): No  ?Physical Activity: Inactive  ? Days of Exercise per Week: 0 days  ? Minutes of Exercise per Session: 0 min  ?Stress: Stress Concern Present  ? Feeling of Stress : To some extent  ?Social Connections: Moderately Isolated  ? Frequency of Communication with Friends and Family: More than three times a week  ?  Frequency of Social Gatherings with Friends and Family: More than three times a week  ? Attends Religious Services: More than 4 times per year  ? Active Member of Clubs or Organizations: No  ? Attends Archivist Meetings: Never  ? Marital Status: Never married  ?Intimate Partner Violence: Not At Risk  ? Fear of Current or Ex-Partner: No  ? Emotionally Abused: No  ? Physically Abused: No  ? Sexually Abused: No  ? ? ?FAMILY HISTORY:  ?Family History  ?Problem Relation Age of Onset  ? Brain cancer Brother   ? Rectal cancer Maternal Uncle   ?      met to bones  ? ? ?CURRENT MEDICATIONS:  ?Current Outpatient Medications  ?Medication Sig Dispense Refill  ? diphenoxylate-atropine (LOMOTIL) 2.5-0.025 MG tablet Take by mouth.    ? lamoTRIgine (LAMICTAL) 25 MG tablet Take by mouth.    ? Lurasidone HCl (LATUDA PO) Take by mouth. Going to be weaned off of this    ? Multiple Vitamin (MULTIVITAMIN) tablet Take 1 tablet by mouth daily.    ? omeprazole (PRILOSEC) 20 MG capsule     ? traZODone (DESYREL) 100 MG tablet Take 200 mg by mouth at bedtime as needed.    ? venlafaxine (EFFEXOR) 100 MG tablet Take by mouth.    ? ?No current facility-administered medications for this visit.  ? ? ?ALLERGIES:  ?Allergies  ?Allergen Reactions  ? Lisinopril Cough  ? ? ?PHYSICAL EXAM:  ?Performance status (ECOG): 1 - Symptomatic but completely ambulatory ? ?Vitals:  ? 05/08/22 1033  ?BP: 116/70  ?Pulse: 75  ?Resp: 18  ?Temp: 98.5 ?F (36.9 ?C)  ?SpO2: 98%  ? ?Wt Readings from Last 3 Encounters:  ?05/08/22 174 lb 8 oz (79.2 kg)  ?09/20/21 139 lb 9.6 oz (63.3 kg)  ? ?Physical Exam ?Vitals reviewed.  ?Constitutional:   ?   Appearance: Normal appearance.  ?Cardiovascular:  ?   Rate and Rhythm: Normal rate and regular rhythm.  ?   Pulses: Normal pulses.  ?   Heart sounds: Normal heart sounds.  ?Pulmonary:  ?   Effort: Pulmonary effort is normal.  ?   Breath sounds: Normal breath sounds.  ?Neurological:  ?   General: No focal deficit present.  ?   Mental Status: She is alert and oriented to person, place, and time.  ?Psychiatric:     ?   Mood and Affect: Mood normal.     ?   Behavior: Behavior normal.  ?  ? ?LABORATORY DATA:  ?I have reviewed the labs as listed.  ? ?  Latest Ref Rng & Units 04/09/2022  ?  3:31 PM  ?CBC  ?WBC 4.0 - 10.5 K/uL 4.7    ?Hemoglobin 12.0 - 15.0 g/dL 12.5    ?Hematocrit 36.0 - 46.0 % 37.5    ?Platelets 150 - 400 K/uL 166    ? ? ?  Latest Ref Rng & Units 04/09/2022  ?  3:31 PM  ?CMP  ?Glucose 70 - 99 mg/dL 82    ?BUN 6 - 20 mg/dL 23    ?Creatinine 0.44 - 1.00 mg/dL 0.59     ?Sodium 135 - 145 mmol/L 139    ?Potassium 3.5 - 5.1 mmol/L 3.9    ?Chloride 98 - 111 mmol/L 108    ?CO2 22 - 32 mmol/L 25    ?Calcium 8.9 - 10.3 mg/dL 8.6    ?Total Protein 6.5 - 8.1 g/dL 6.7    ?Total Bilirubin 0.3 -  1.2 mg/dL 0.3    ?Alkaline Phos 38 - 126 U/L 67    ?AST 15 - 41 U/L 43    ?ALT 0 - 44 U/L 46    ? ? ?DIAGNOSTIC IMAGING:  ?I have independently reviewed the scans and discussed with the patient. ?CT Abdomen Pelvis W Contrast ? ?Result Date: 04/18/2022 ?CLINICAL DATA:  History of rectal cancer, status post chemo and radiation. * Tracking Code: BO * EXAM: CT ABDOMEN AND PELVIS WITH CONTRAST TECHNIQUE: Multidetector CT imaging of the abdomen and pelvis was performed using the standard protocol following bolus administration of intravenous contrast. RADIATION DOSE REDUCTION: This exam was performed according to the departmental dose-optimization program which includes automated exposure control, adjustment of the mA and/or kV according to patient size and/or use of iterative reconstruction technique. CONTRAST:  41m ISOVUE-370 IOPAMIDOL (ISOVUE-370) INJECTION 76% COMPARISON:  None. FINDINGS: Lower chest: No suspicious pulmonary nodules in the lung bases. No acute abnormality. Hepatobiliary: No suspicious hepatic lesions. Nodular hepatic contour. Gallbladder is surgically absent. There is prominence of the biliary tree with the common duct measuring 9 mm, likely reflecting reservoir effect post cholecystectomy however in the setting of no prior imaging for comparison would consider correlation with laboratory values to exclude obstruction. Pancreas: No pancreatic ductal dilation or evidence of acute inflammation. Spleen: No splenomegaly or focal splenic lesion. Adrenals/Urinary Tract: Bilateral adrenal glands appear normal. No hydronephrosis. Kidneys demonstrate symmetric enhancement and excretion of contrast material. Urinary bladder is unremarkable for degree of distension. Stomach/Bowel: Mild  asymmetric rectal wall thickening with stranding in the mesorectal fat for instance on image 73-74/2 possibly reflecting patient's treated rectal cancer. Radiopaque enteric contrast material was administered. P

## 2022-05-08 NOTE — Patient Instructions (Signed)
[place] at [facility] ?Discharge Instructions ? ? ?You were seen and examined today by Dr. Delton Coombes. ? ?He reviewed the results of your lab work and scan. All results are normal/stable.  ? ?We will repeat a scan in 1 year and see you back after the scan. ? ? ? ?Thank you for choosing [place] at [facility] to provide your oncology and hematology care.  To afford each patient quality time with our provider, please arrive at least 15 minutes before your scheduled appointment time.  ? ?If you have a lab appointment with the [facility] please come in thru the Main Entrance and check in at the main information desk. ? ?You need to re-schedule your appointment should you arrive 10 or more minutes late.  We strive to give you quality time with our providers, and arriving late affects you and other patients whose appointments are after yours.  Also, if you no show three or more times for appointments you may be dismissed from the clinic at the providers discretion.     ?Again, thank you for choosing [facility].  Our hope is that these requests will decrease the amount of time that you wait before being seen by our physicians.       ?_____________________________________________________________ ? ?Should you have questions after your visit to [facility], please contact our office at [phone] and follow the prompts.  Our office hours are 8:00 a.m. and 4:30 p.m. Monday - Friday.  Please note that voicemails left after 4:00 p.m. may not be returned until the following business day.  We are closed weekends and major holidays.  You do have access to a nurse 24-7, just call the main number to the clinic [phone] and do not press any options, hold on the line and a nurse will answer the phone.   ? ?For prescription refill requests, have your pharmacy contact our office and allow 72 hours.   ? ?Due to Covid, you will need to wear a mask  upon entering the hospital. If you do not have a mask, a mask will be given to you at the Main Entrance upon arrival. For doctor visits, patients may have 1 support person age 69 or older with them. For treatment visits, patients can not have anyone with them due to social distancing guidelines and our immunocompromised population.  ? ?   ?

## 2022-05-09 ENCOUNTER — Other Ambulatory Visit (HOSPITAL_COMMUNITY): Payer: Self-pay | Admitting: *Deleted

## 2022-05-09 DIAGNOSIS — C210 Malignant neoplasm of anus, unspecified: Secondary | ICD-10-CM

## 2022-05-09 DIAGNOSIS — D508 Other iron deficiency anemias: Secondary | ICD-10-CM

## 2022-05-19 ENCOUNTER — Encounter (HOSPITAL_COMMUNITY): Payer: Self-pay | Admitting: *Deleted

## 2022-05-19 ENCOUNTER — Ambulatory Visit
Admission: EM | Admit: 2022-05-19 | Discharge: 2022-05-19 | Disposition: A | Discharge status: Home / Self Care | Payer: 59

## 2022-05-19 ENCOUNTER — Emergency Department (HOSPITAL_COMMUNITY)
Admission: EM | Admit: 2022-05-19 | Discharge: 2022-05-19 | Disposition: A | Discharge status: Home / Self Care | Payer: 59 | Attending: Emergency Medicine | Admitting: Emergency Medicine

## 2022-05-19 ENCOUNTER — Other Ambulatory Visit: Payer: Self-pay

## 2022-05-19 DIAGNOSIS — G4700 Insomnia, unspecified: Secondary | ICD-10-CM | POA: Diagnosis not present

## 2022-05-19 DIAGNOSIS — F419 Anxiety disorder, unspecified: Secondary | ICD-10-CM | POA: Diagnosis not present

## 2022-05-19 DIAGNOSIS — T7840XA Allergy, unspecified, initial encounter: Secondary | ICD-10-CM | POA: Diagnosis not present

## 2022-05-19 DIAGNOSIS — M791 Myalgia, unspecified site: Secondary | ICD-10-CM | POA: Diagnosis not present

## 2022-05-19 DIAGNOSIS — R61 Generalized hyperhidrosis: Secondary | ICD-10-CM

## 2022-05-19 DIAGNOSIS — R251 Tremor, unspecified: Secondary | ICD-10-CM | POA: Diagnosis not present

## 2022-05-19 DIAGNOSIS — R69 Illness, unspecified: Secondary | ICD-10-CM | POA: Diagnosis not present

## 2022-05-19 HISTORY — DX: Depression, unspecified: F32.A

## 2022-05-19 HISTORY — DX: Anxiety disorder, unspecified: F41.9

## 2022-05-19 HISTORY — DX: Post-traumatic stress disorder, unspecified: F43.10

## 2022-05-19 HISTORY — DX: Bipolar disorder, unspecified: F31.9

## 2022-05-19 HISTORY — DX: Borderline personality disorder: F60.3

## 2022-05-19 LAB — POCT URINALYSIS DIP (MANUAL ENTRY)
Bilirubin, UA: NEGATIVE
Blood, UA: NEGATIVE
Glucose, UA: NEGATIVE mg/dL
Ketones, POC UA: NEGATIVE mg/dL
Leukocytes, UA: NEGATIVE
Nitrite, UA: NEGATIVE
Protein Ur, POC: NEGATIVE mg/dL
Spec Grav, UA: 1.01 (ref 1.010–1.025)
Urobilinogen, UA: 0.2 E.U./dL
pH, UA: 6.5 (ref 5.0–8.0)

## 2022-05-19 LAB — COMPREHENSIVE METABOLIC PANEL
ALT: 41 U/L (ref 0–44)
AST: 42 U/L — ABNORMAL HIGH (ref 15–41)
Albumin: 4.4 g/dL (ref 3.5–5.0)
Alkaline Phosphatase: 71 U/L (ref 38–126)
Anion gap: 10 (ref 5–15)
BUN: 33 mg/dL — ABNORMAL HIGH (ref 6–20)
CO2: 25 mmol/L (ref 22–32)
Calcium: 9.2 mg/dL (ref 8.9–10.3)
Chloride: 104 mmol/L (ref 98–111)
Creatinine, Ser: 0.65 mg/dL (ref 0.44–1.00)
GFR, Estimated: >60 mL/min (ref >60)
Glucose, Bld: 94 mg/dL (ref 70–99)
Potassium: 4.5 mmol/L (ref 3.5–5.1)
Sodium: 139 mmol/L (ref 135–145)
Total Bilirubin: 0.4 mg/dL (ref 0.3–1.2)
Total Protein: 7.3 g/dL (ref 6.5–8.1)

## 2022-05-19 LAB — CBC WITH DIFFERENTIAL/PLATELET
Abs Immature Granulocytes: 0.03 10*3/uL (ref 0.00–0.07)
Basophils Absolute: 0.1 10*3/uL (ref 0.0–0.1)
Basophils Relative: 1 %
Eosinophils Absolute: 0.2 10*3/uL (ref 0.0–0.5)
Eosinophils Relative: 4 %
HCT: 39.5 % (ref 36.0–46.0)
Hemoglobin: 13.3 g/dL (ref 12.0–15.0)
Immature Granulocytes: 1 %
Lymphocytes Relative: 17 %
Lymphs Abs: 0.9 10*3/uL (ref 0.7–4.0)
MCH: 30.4 pg (ref 26.0–34.0)
MCHC: 33.7 g/dL (ref 30.0–36.0)
MCV: 90.2 fL (ref 80.0–100.0)
Monocytes Absolute: 0.4 10*3/uL (ref 0.1–1.0)
Monocytes Relative: 8 %
Neutro Abs: 3.6 10*3/uL (ref 1.7–7.7)
Neutrophils Relative %: 69 %
Platelets: 175 10*3/uL (ref 150–400)
RBC: 4.38 MIL/uL (ref 3.87–5.11)
RDW: 12.8 % (ref 11.5–15.5)
WBC: 5.1 10*3/uL (ref 4.0–10.5)
nRBC: 0 % (ref 0.0–0.2)

## 2022-05-19 MED ORDER — LORAZEPAM 1 MG PO TABS
1.0000 mg | ORAL_TABLET | Freq: Once | ORAL | Status: AC
Start: 2022-05-19 — End: 2022-05-19
  Administered 2022-05-19: 1 mg via ORAL
  Filled 2022-05-19: qty 1

## 2022-05-19 NOTE — ED Provider Notes (Signed)
[facility] EMERGENCY DEPARTMENT Provider Note   CSN: [id number] Arrival date & time: 05/19/22  1600     History  Chief Complaint  Patient presents with   Allergic Reaction    Barbara Meyer is a 61 y.o. female.  Patient has a history of bipolar posttraumatic stress syndrome and depression.  Her psychiatrist has weaned her off of 2 medicines and started on Seroquel.  The old 2 medicines for last use yesterday and she is complaining of being anxious today.  No suicidal ideation  The history is provided by the patient and medical records. No language interpreter was used.  Allergic Reaction Presenting symptoms: no rash   Severity:  Moderate Prior allergic episodes:  No prior episodes Context: not animal exposure   Relieved by:  Nothing Worsened by:  Nothing Ineffective treatments:  None tried     Home Medications Prior to Admission medications   Medication Sig Start Date End Date Taking? Authorizing Provider  diphenoxylate-atropine (LOMOTIL) 2.5-0.025 MG tablet Take by mouth. 10/30/21   [provider]  lamoTRIgine (LAMICTAL) 25 MG tablet Take by mouth. 10/24/21   [provider]  Lurasidone HCl (LATUDA PO) Take by mouth. Going to be weaned off of this    [provider]  Multiple Vitamin (MULTIVITAMIN) tablet Take 1 tablet by mouth daily.    [provider]  omeprazole (PRILOSEC) 20 MG capsule  09/25/21   [provider]  QUEtiapine (SEROQUEL) 100 MG tablet Take by mouth. 05/14/22   [provider]  traZODone (DESYREL) 100 MG tablet Take 200 mg by mouth at bedtime as needed. 09/07/21   [provider]  venlafaxine (EFFEXOR) 100 MG tablet Take by mouth. 09/07/21   [provider]      Allergies    Lisinopril    Review of Systems   Review of Systems  Constitutional:  Negative for appetite change and fatigue.  HENT:  Negative for congestion, ear discharge and sinus pressure.   Eyes:  Negative for  discharge.  Respiratory:  Negative for cough.   Cardiovascular:  Negative for chest pain.  Gastrointestinal:  Negative for abdominal pain and diarrhea.  Genitourinary:  Negative for frequency and hematuria.  Musculoskeletal:  Negative for back pain.  Skin:  Negative for rash.  Neurological:  Negative for seizures and headaches.  Psychiatric/Behavioral:  Positive for agitation. Negative for hallucinations.    Physical Exam Updated Vital Signs BP (!) 155/84 (BP Location: Left Arm)   Pulse 73   Temp 98.5 F (36.9 C) (Oral)   Resp (!) 22   Ht 5' 6.5" (1.689 m)   Wt 77.1 kg   SpO2 100%   BMI 27.03 kg/m  Physical Exam Vitals and nursing note reviewed.  Constitutional:      Appearance: She is well-developed.  HENT:     Head: Normocephalic.     Nose: Nose normal.  Eyes:     General: No scleral icterus.    Conjunctiva/sclera: Conjunctivae normal.  Neck:     Thyroid: No thyromegaly.  Cardiovascular:     Rate and Rhythm: Normal rate and regular rhythm.     Heart sounds: No murmur heard.   No friction rub. No gallop.  Pulmonary:     Breath sounds: No stridor. No wheezing or rales.  Chest:     Chest wall: No tenderness.  Abdominal:     General: There is no distension.     Tenderness: There is no abdominal tenderness. There is no rebound.  Musculoskeletal:  General: Normal range of motion.     Cervical back: Neck supple.  Lymphadenopathy:     Cervical: No cervical adenopathy.  Skin:    Findings: No erythema or rash.  Neurological:     Mental Status: She is alert and oriented to person, place, and time.     Motor: No abnormal muscle tone.     Coordination: Coordination normal.  Psychiatric:     Comments: Patient is anxious but not suicidal or homicidal    ED Results / Procedures / Treatments   Labs (all labs ordered are listed, but only abnormal results are displayed) Labs Reviewed  COMPREHENSIVE METABOLIC PANEL - Abnormal; Notable for the following components:       Result Value   BUN 33 (*)    AST 42 (*)    All other components within normal limits  CBC WITH DIFFERENTIAL/PLATELET    EKG None  Radiology No results found.  Procedures Procedures    Medications Ordered in ED Medications  LORazepam (ATIVAN) tablet 1 mg (1 mg Oral Given 05/19/22 1624)    ED Course/ Medical Decision Making/ A&P                           Medical Decision Making Amount and/or Complexity of Data Reviewed Labs: ordered.  Risk Prescription drug management.  This patient presents to the ED for concern of anxiety, this involves an extensive number of treatment options, and is a complaint that carries with it a high risk of complications and morbidity.  The differential diagnosis includes anxiety related to tapering off of psychiatric medicines, metabolic derangement   Co morbidities that complicate the patient evaluation  Posttraumatic stress syndrome and bipolar and depression   Additional history obtained:  Additional history obtained from patient External records from outside source obtained and reviewed including hospital record   Lab Tests:  I Ordered, and personally interpreted labs.  The pertinent results include: CBC unremarkable chemistries show minimally elevated BUN   Imaging Studies ordered:  No imaging  Cardiac Monitoring: / EKG:  The patient was maintained on a cardiac monitor.  I personally viewed and interpreted the cardiac monitored which showed an underlying rhythm of: Normal sinus rhythm   Consultations Obtained:  Consult Problem List / ED Course / Critical interventions / Medication management  IV, bipolar, depression I ordered medication including Ativan for anxiety Reevaluation of the patient after these medicines showed that the patient stayed the same I have reviewed the patients home medicines and have made adjustments as needed   Social Determinants of Health:  None   Test / Admission -  Considered:  Considered CT of the head.  Patient does not meet inpatient criteria  Patient with increased anxiety after being tapered off of 2 psychiatric pills.  She was given Ativan without any help.  Patient was started on Seroquel and takes 100 at bedtime.  We will increase this so she is taking 100 at bedtime and 50 in the morning and she will call her psychiatrist on Tuesday and discuss her symptoms.  Suspect anxiety is related to being tapered off of her previous 2 psychiatric medicines        Final Clinical Impression(s) / ED Diagnoses Final diagnoses:  Allergic reaction, initial encounter    Rx / DC Orders ED Discharge Orders     None         Milton Ferguson, MD 05/19/22 1746

## 2022-05-19 NOTE — ED Provider Notes (Signed)
  [facility] Provider Note   CSN: [id number] Arrival date & time: 05/19/22  1600     History {Add pertinent medical, surgical, social history, OB history to HPI:1} Chief Complaint  Patient presents with   Allergic Reaction    Barbara Meyer is a 61 y.o. female.   Allergic Reaction     Home Medications Prior to Admission medications   Medication Sig Start Date End Date Taking? Authorizing Provider  diphenoxylate-atropine (LOMOTIL) 2.5-0.025 MG tablet Take by mouth. 10/30/21   [provider]  lamoTRIgine (LAMICTAL) 25 MG tablet Take by mouth. 10/24/21   [provider]  Lurasidone HCl (LATUDA PO) Take by mouth. Going to be weaned off of this    [provider]  Multiple Vitamin (MULTIVITAMIN) tablet Take 1 tablet by mouth daily.    [provider]  omeprazole (PRILOSEC) 20 MG capsule  09/25/21   [provider]  QUEtiapine (SEROQUEL) 100 MG tablet Take by mouth. 05/14/22   [provider]  traZODone (DESYREL) 100 MG tablet Take 200 mg by mouth at bedtime as needed. 09/07/21   [provider]  venlafaxine (EFFEXOR) 100 MG tablet Take by mouth. 09/07/21   [provider]      Allergies    Lisinopril    Review of Systems   Review of Systems  Physical Exam Updated Vital Signs BP (!) 155/84 (BP Location: Left Arm)   Pulse 73   Temp 98.5 F (36.9 C) (Oral)   Resp (!) 22   Ht 5' 6.5" (1.689 m)   Wt 77.1 kg   SpO2 100%   BMI 27.03 kg/m  Physical Exam  ED Results / Procedures / Treatments   Labs (all labs ordered are listed, but only abnormal results are displayed) Labs Reviewed  COMPREHENSIVE METABOLIC PANEL - Abnormal; Notable for the following components:      Result Value   BUN 33 (*)    AST 42 (*)    All other components within normal limits  CBC WITH DIFFERENTIAL/PLATELET    EKG None  Radiology No results found.  Procedures Procedures  {Document cardiac monitor,  telemetry assessment procedure when appropriate:1}  Medications Ordered in ED Medications  LORazepam (ATIVAN) tablet 1 mg (1 mg Oral Given 05/19/22 1624)    ED Course/ Medical Decision Making/ A&P                           Medical Decision Making Amount and/or Complexity of Data Reviewed Labs: ordered.  Risk Prescription drug management.   ***  {Document critical care time when appropriate:1} {Document review of labs and clinical decision tools ie heart score, Chads2Vasc2 etc:1}  {Document your independent review of radiology images, and any outside records:1} {Document your discussion with family members, caretakers, and with consultants:1} {Document social determinants of health affecting pt's care:1} {Document your decision making why or why not admission, treatments were needed:1} Final Clinical Impression(s) / ED Diagnoses Final diagnoses:  Allergic reaction, initial encounter    Rx / DC Orders ED Discharge Orders     None

## 2022-05-19 NOTE — ED Triage Notes (Addendum)
Pt started on Seroquel and tapered off trazodone.  Today began to have feelings of anxiety, chills and bone pain

## 2022-05-19 NOTE — ED Triage Notes (Signed)
Pt reports lower abdominal pain, body aches, chills since this morning. Pt think symptoms are related to Latuda and trazodone withdrawal, as he Psychiatrist took her out in 6 days. Pt state she as reading in [employer] she needs to go to a facility for Latuda tapering.

## 2022-05-19 NOTE — ED Notes (Signed)
Patient is being discharged from the Urgent Care and sent to the Emergency Department via private vehicle . Per PA, patient is in need of higher level of care due to medication levels being altered and new symptoms. Patient is aware and verbalizes understanding of plan of care.  Vitals:   05/19/22 1512  BP: (!) 138/93  Pulse: 80  Resp: 18  Temp: 98.7 F (37.1 C)  SpO2: 98%

## 2022-05-19 NOTE — Discharge Instructions (Signed)
Continue taking your Seroquel 100 mg at bedtime but also start taking 50 mg in the morning approximately 12 hours after you take your nighttime dose.  Call your psychiatrist Tuesday and let them know how you are doing

## 2022-05-21 NOTE — ED Provider Notes (Signed)
[facility]    CSN: [id number] Arrival date & time: 05/19/22  1339      History   Chief Complaint Chief Complaint  Patient presents with   Abdominal Pain   Chills   Generalized Body Aches         HPI Barbara Meyer is a 61 y.o. female.   Presenting today with new onset abdominal pain, generalized myalgias, chills, insomnia, sweats, tremors, anxiousness, stiffness the past few days since her new psychiatrist had her quick taper off of her Latuda and trazodone and start titrating up new medication Seroquel.  She is now fully off of Latuda and the trazodone and taking 50 mg the past 2 days of Seroquel.  She denies chest pain, shortness of breath, vomiting, diarrhea, fevers that she is aware of.  She is concerned about a medication reaction versus withdrawal symptoms and states that her psychiatrist is not open today.  History of bipolar disorder, anxiety, depression, PTSD.   Past Medical History:  Diagnosis Date   Anxiety    Bipolar 1 disorder ([place])    Borderline personality disorder ([place])    Depression    PTSD (post-traumatic stress disorder)     Patient Active Problem List   Diagnosis Date Noted   Anal cancer ([place]) 09/20/2021    Past Surgical History:  Procedure Laterality Date   GASTRIC BYPASS  10/2019   HERNIA REPAIR  2022   hernia repair and mass removal   WRIST FRACTURE SURGERY Left 12/20/2020    OB History   No obstetric history on file.      Home Medications    Prior to Admission medications   Medication Sig Start Date End Date Taking? Authorizing Provider  diphenoxylate-atropine (LOMOTIL) 2.5-0.025 MG tablet Take by mouth. 10/30/21   [provider]  lamoTRIgine (LAMICTAL) 25 MG tablet Take by mouth. 10/24/21   [provider]  Lurasidone HCl (LATUDA PO) Take by mouth. Going to be weaned off of this    [provider]  Multiple Vitamin (MULTIVITAMIN) tablet Take 1 tablet by mouth daily.    [provider]   omeprazole (PRILOSEC) 20 MG capsule  09/25/21   [provider]  QUEtiapine (SEROQUEL) 100 MG tablet Take by mouth. 05/14/22   [provider]  traZODone (DESYREL) 100 MG tablet Take 200 mg by mouth at bedtime as needed. 09/07/21   [provider]  venlafaxine (EFFEXOR) 100 MG tablet Take by mouth. 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 --      Excl. in [place]? --    No data found.  Updated Vital Signs BP (!) 138/93 (BP Location: Right Arm)   Pulse 80   Temp 98.7 F (37.1 C) (Oral)   Resp 18   SpO2 98%   Visual Acuity Right Eye Distance:   Left Eye Distance:   Bilateral Distance:    Right Eye Near:   Left Eye Near:    Bilateral Near:     Physical Exam Vitals and nursing note reviewed.  Constitutional:      Comments: Appears anxious  HENT:     Head: Atraumatic.     Mouth/Throat:     Mouth: Mucous membranes are moist.  Eyes:     Extraocular Movements: Extraocular movements intact.     Conjunctiva/sclera:  Conjunctivae normal.  Cardiovascular:     Rate and Rhythm: Normal rate and regular rhythm.     Heart sounds: Normal heart sounds.  Pulmonary:     Effort: Pulmonary effort is normal.     Breath sounds: Normal breath sounds. No wheezing or rales.  Musculoskeletal:        General: Normal range of motion.     Cervical back: Normal range of motion and neck supple.  Skin:    General: Skin is warm and dry.  Neurological:     Mental Status: She is alert and oriented to person, place, and time.     Motor: No weakness.     Gait: Gait normal.  Psychiatric:     Comments: Anxious, hands shaking     UC Treatments / Results  Labs (all labs ordered are listed, but only abnormal results are displayed) Labs Reviewed  POCT URINALYSIS DIP (MANUAL ENTRY)    EKG   Radiology No results found.  Procedures Procedures (including critical care time)  Medications Ordered in UC Medications - No data to display  Initial Impression / Assessment and Plan / UC Course  I have reviewed the triage vital signs and the nursing notes.  Pertinent labs & imaging results that were available during my care of the patient were reviewed by me and considered in my medical decision making (see chart for details).     Unclear at this time if this may be a side effect/reaction to the new medication Seroquel, withdrawal symptoms, serotonin syndrome given numerous psychotropic medications on board.  Discussed with patient for safety monitoring that she would be most benefited from the emergency department.  She is readily agreeable to this and wishes to go via private vehicle, will have someone drive her.  She is hemodynamically stable for transport.  Final Clinical Impressions(s) / UC Diagnoses   Final diagnoses:  Tremor  Diaphoresis  Insomnia, unspecified type  Myalgia   Discharge Instructions   None    ED Prescriptions   None    PDMP not reviewed this encounter.   Volney American,  [place] 05/21/22 1936

## 2022-06-04 DIAGNOSIS — G4700 Insomnia, unspecified: Secondary | ICD-10-CM | POA: Diagnosis not present

## 2022-06-04 DIAGNOSIS — R69 Illness, unspecified: Secondary | ICD-10-CM | POA: Diagnosis not present

## 2022-06-04 DIAGNOSIS — Z79899 Other long term (current) drug therapy: Secondary | ICD-10-CM | POA: Diagnosis not present

## 2022-06-04 DIAGNOSIS — F4312 Post-traumatic stress disorder, chronic: Secondary | ICD-10-CM | POA: Diagnosis not present

## 2022-06-04 DIAGNOSIS — F419 Anxiety disorder, unspecified: Secondary | ICD-10-CM | POA: Diagnosis not present

## 2022-07-06 DIAGNOSIS — G4700 Insomnia, unspecified: Secondary | ICD-10-CM | POA: Diagnosis not present

## 2022-07-06 DIAGNOSIS — R69 Illness, unspecified: Secondary | ICD-10-CM | POA: Diagnosis not present

## 2022-07-06 DIAGNOSIS — F4312 Post-traumatic stress disorder, chronic: Secondary | ICD-10-CM | POA: Diagnosis not present

## 2022-07-06 DIAGNOSIS — Z79899 Other long term (current) drug therapy: Secondary | ICD-10-CM | POA: Diagnosis not present

## 2022-07-06 DIAGNOSIS — F419 Anxiety disorder, unspecified: Secondary | ICD-10-CM | POA: Diagnosis not present

## 2022-07-19 DIAGNOSIS — R69 Illness, unspecified: Secondary | ICD-10-CM | POA: Diagnosis not present

## 2022-07-19 DIAGNOSIS — F4312 Post-traumatic stress disorder, chronic: Secondary | ICD-10-CM | POA: Diagnosis not present

## 2022-07-19 DIAGNOSIS — G4700 Insomnia, unspecified: Secondary | ICD-10-CM | POA: Diagnosis not present

## 2022-07-19 DIAGNOSIS — Z79899 Other long term (current) drug therapy: Secondary | ICD-10-CM | POA: Diagnosis not present

## 2022-08-02 DIAGNOSIS — Z79899 Other long term (current) drug therapy: Secondary | ICD-10-CM | POA: Diagnosis not present

## 2022-08-09 DIAGNOSIS — Z79899 Other long term (current) drug therapy: Secondary | ICD-10-CM | POA: Diagnosis not present

## 2022-08-09 DIAGNOSIS — G4709 Other insomnia: Secondary | ICD-10-CM | POA: Diagnosis not present

## 2022-08-09 DIAGNOSIS — F4312 Post-traumatic stress disorder, chronic: Secondary | ICD-10-CM | POA: Diagnosis not present

## 2022-08-09 DIAGNOSIS — R69 Illness, unspecified: Secondary | ICD-10-CM | POA: Diagnosis not present

## 2022-08-15 ENCOUNTER — Other Ambulatory Visit: Payer: Self-pay

## 2022-08-15 ENCOUNTER — Emergency Department (HOSPITAL_COMMUNITY)
Admission: EM | Admit: 2022-08-15 | Discharge: 2022-08-15 | Discharge status: Against Medical Advice | Payer: 59 | Attending: Emergency Medicine | Admitting: Emergency Medicine

## 2022-08-15 ENCOUNTER — Emergency Department (HOSPITAL_COMMUNITY): Payer: 59

## 2022-08-15 ENCOUNTER — Encounter (HOSPITAL_COMMUNITY): Payer: Self-pay | Admitting: Emergency Medicine

## 2022-08-15 DIAGNOSIS — T189XXA Foreign body of alimentary tract, part unspecified, initial encounter: Secondary | ICD-10-CM | POA: Diagnosis not present

## 2022-08-15 DIAGNOSIS — R070 Pain in throat: Secondary | ICD-10-CM | POA: Diagnosis not present

## 2022-08-15 DIAGNOSIS — R0989 Other specified symptoms and signs involving the circulatory and respiratory systems: Secondary | ICD-10-CM | POA: Diagnosis not present

## 2022-08-15 DIAGNOSIS — Z5321 Procedure and treatment not carried out due to patient leaving prior to being seen by health care provider: Secondary | ICD-10-CM | POA: Diagnosis not present

## 2022-08-15 MED ORDER — NITROGLYCERIN 0.4 MG SL SUBL: 0.4000 mg | SUBLINGUAL_TABLET | Freq: Once | SUBLINGUAL | Status: DC

## 2022-08-15 MED ORDER — GLUCAGON HCL RDNA (DIAGNOSTIC) 1 MG IJ SOLR
1.0000 mg | Freq: Once | INTRAMUSCULAR | Status: AC | PRN
  Administered 2022-08-15: 1 mg via INTRAMUSCULAR
  Filled 2022-08-15: qty 1

## 2022-08-15 NOTE — ED Provider Triage Note (Signed)
Emergency Medicine Provider Triage Evaluation Note  Barbara Meyer , a 61 y.o. female  was evaluated in triage.  Pt complains of feeling of foreign body in throat.  Patient reports that she was eating apples around 530, swallowed a piece, feels like it is caught in her throat.  She reports that she tried to get it out but could not.  She is currently tolerating her own secretions but is very painful to swallow.  She reports no history of same, no history of esophageal stricture..  Review of Systems  Positive: Foreign body sensation in throat Negative: Stridor, difficulty breathing, inability tolerate secretions  Physical Exam  BP (!) 169/97 (BP Location: Right Arm)   Pulse 76   Temp 98.2 F (36.8 C) (Oral)   Resp 18   Ht 5' 6.5" (1.689 m)   Wt 77.1 kg   SpO2 99%   BMI 27.03 kg/m  Gen:   Awake, no distress   Resp:  Normal effort  MSK:   Moves extremities without difficulty  Other:  No acute distress  Medical Decision Making  Medically screening exam initiated at 7:52 PM.  Appropriate orders placed.  Elmarie Devlin was informed that the remainder of the evaluation will be completed by another provider, this initial triage assessment does not replace that evaluation, and the importance of remaining in the ED until their evaluation is complete.  We will try nitroglycerin dissolved in water in triage otherwise patient will need room for further evaluation, possible glucagon administration, possible GI consult if concern for ongoing foreign body.  She is maintaining her airway appropriately at this time.   Anselmo Pickler, [place] 08/15/22 1952

## 2022-08-15 NOTE — ED Triage Notes (Signed)
Pt states has apple stuck in throat. Pt drank water and it was able to go down. Nad. Able to swallow secretions "but its hard". No resp distress or sob noted.

## 2022-08-27 ENCOUNTER — Encounter (HOSPITAL_COMMUNITY): Payer: Self-pay

## 2022-08-27 ENCOUNTER — Other Ambulatory Visit: Payer: Self-pay

## 2022-08-27 ENCOUNTER — Emergency Department (HOSPITAL_COMMUNITY)
Admission: EM | Admit: 2022-08-27 | Discharge: 2022-08-27 | Disposition: A | Discharge status: Home / Self Care | Payer: 59 | Attending: Emergency Medicine | Admitting: Emergency Medicine

## 2022-08-27 DIAGNOSIS — R197 Diarrhea, unspecified: Secondary | ICD-10-CM | POA: Insufficient documentation

## 2022-08-27 DIAGNOSIS — R9431 Abnormal electrocardiogram [ECG] [EKG]: Secondary | ICD-10-CM | POA: Diagnosis not present

## 2022-08-27 DIAGNOSIS — R5383 Other fatigue: Secondary | ICD-10-CM | POA: Diagnosis not present

## 2022-08-27 DIAGNOSIS — M791 Myalgia, unspecified site: Secondary | ICD-10-CM | POA: Insufficient documentation

## 2022-08-27 DIAGNOSIS — Z20822 Contact with and (suspected) exposure to covid-19: Secondary | ICD-10-CM | POA: Insufficient documentation

## 2022-08-27 DIAGNOSIS — R52 Pain, unspecified: Secondary | ICD-10-CM

## 2022-08-27 LAB — BASIC METABOLIC PANEL
Anion gap: 8 (ref 5–15)
BUN: 26 mg/dL — ABNORMAL HIGH (ref 6–20)
CO2: 24 mmol/L (ref 22–32)
Calcium: 9 mg/dL (ref 8.9–10.3)
Chloride: 108 mmol/L (ref 98–111)
Creatinine, Ser: 0.71 mg/dL (ref 0.44–1.00)
GFR, Estimated: >60 mL/min (ref >60)
Glucose, Bld: 102 mg/dL — ABNORMAL HIGH (ref 70–99)
Potassium: 5 mmol/L (ref 3.5–5.1)
Sodium: 140 mmol/L (ref 135–145)

## 2022-08-27 LAB — URINALYSIS, ROUTINE W REFLEX MICROSCOPIC
Bacteria, UA: NONE SEEN
Bilirubin Urine: NEGATIVE
Glucose, UA: NEGATIVE mg/dL
Hgb urine dipstick: NEGATIVE
Ketones, ur: NEGATIVE mg/dL
Nitrite: NEGATIVE
Protein, ur: NEGATIVE mg/dL
Specific Gravity, Urine: 1.021 (ref 1.005–1.030)
pH: 5 (ref 5.0–8.0)

## 2022-08-27 LAB — CBC WITH DIFFERENTIAL/PLATELET
Abs Immature Granulocytes: 0.04 10*3/uL (ref 0.00–0.07)
Basophils Absolute: 0 10*3/uL (ref 0.0–0.1)
Basophils Relative: 1 %
Eosinophils Absolute: 0.4 10*3/uL (ref 0.0–0.5)
Eosinophils Relative: 7 %
HCT: 39.8 % (ref 36.0–46.0)
Hemoglobin: 12.9 g/dL (ref 12.0–15.0)
Immature Granulocytes: 1 %
Lymphocytes Relative: 13 %
Lymphs Abs: 0.8 10*3/uL (ref 0.7–4.0)
MCH: 30.1 pg (ref 26.0–34.0)
MCHC: 32.4 g/dL (ref 30.0–36.0)
MCV: 92.8 fL (ref 80.0–100.0)
Monocytes Absolute: 0.5 10*3/uL (ref 0.1–1.0)
Monocytes Relative: 8 %
Neutro Abs: 4.4 10*3/uL (ref 1.7–7.7)
Neutrophils Relative %: 70 %
Platelets: 194 10*3/uL (ref 150–400)
RBC: 4.29 MIL/uL (ref 3.87–5.11)
RDW: 14.5 % (ref 11.5–15.5)
WBC: 6.2 10*3/uL (ref 4.0–10.5)
nRBC: 0 % (ref 0.0–0.2)

## 2022-08-27 LAB — RESP PANEL BY RT-PCR (FLU A&B, COVID) ARPGX2
Influenza A by PCR: NEGATIVE
Influenza B by PCR: NEGATIVE
SARS Coronavirus 2 by RT PCR: NEGATIVE

## 2022-08-27 MED ORDER — ACETAMINOPHEN 325 MG PO TABS
650.0000 mg | ORAL_TABLET | Freq: Once | ORAL | Status: AC
  Administered 2022-08-27: 650 mg via ORAL
  Filled 2022-08-27: qty 2

## 2022-08-27 NOTE — Discharge Instructions (Addendum)
Your overall work-up was unremarkable today.  Recommend close follow-up with your primary care for further evaluation within the next 3 to 5 days.  Also recommend staying hydrated, resting, and keeping up with adequate nutritional intake.  You may continue to utilize Tylenol as well for body aches, as needed.  Return to the ED for any new or worsening symptoms as discussed.  If you do not have a doctor see the list below.  RESOURCE GUIDE  Chronic Pain Problems: Contact [facility]  [phone] Patients need to be referred by their primary care doctor.  Insufficient Money for Medicine: Contact [employer]:  call "211" or [place] [phone].  No Primary Care Doctor: Call [facility]  [phone] - can help you locate a primary care doctor that  accepts your insurance, provides certain services, etc. Physician Referral Service- [phone] Agencies that provide inexpensive medical care: [facility]  [facility]  [phone] [facility]  [phone] [facility]  [phone] Planned Parenthood  [phone] [facility]  [phone]  [place] Providers: [facility]- [address], [address]  [phone], Mon-Fri 9am-7pm, Sat 9am-1pm [place], [address] [place], [address] [place]  [place]- [address]  [place], [address], [phone]  Only accepts [place] patients after they have their name  applied to their card  Self Pay (no insurance) in [facility]: Sickle Cell Patients: Dr Kevan Ny, [facility]  [facility], [facility]- [place]  [facility] [place]- [address], [facility]- see information above (Speak to [employer] if you do not have insurance)       -  [facility]- [place], [place],  [place], [place]  Dr Vista Lawman-  [address], [place], [facility]- [address] [phone]       -  [facility]- [address], [place], also [address], [phone]       -    [facility]- [address], [place], 1st & 3rd Saturday   every month, 10am-1pm  1) Find a Doctor and Pay Out of Pocket Although you won't have to find out who is covered by your insurance plan, it is a good idea to ask around and get recommendations. You will then need to call the office and see if the doctor you have chosen will accept you as a new patient and what types of options they offer for patients who are self-pay. Some doctors offer discounts or will set up payment plans for their patients who do not have insurance, but you will need to ask so you aren't surprised when you get to your appointment.  2) Contact Your Local Health Department Not all health  departments have doctors that can see patients for sick visits, but many do, so it is worth a call to see if yours does. If you don't know where your local health department is, you can check in your phone book. The CDC also has a tool to help you locate your state's health department, and many state websites also have listings of all of their local health departments.  3) Find a [facility] Clinic If your illness is not likely to be very severe or complicated, you may want to try a walk in clinic. These are popping up all over the country in pharmacies, drugstores, and shopping centers. They're usually staffed by nurse practitioners or physician assistants that have been trained to treat common illnesses  and complaints. They're usually fairly quick and inexpensive. However, if you have serious medical issues or chronic medical problems, these are probably not your best option  [facility] [place], [facility], [address], [place], phone [phone] or [phone].  Monday - Friday, call for an appointment. [place], [facility], [address], [place], phone [phone] or [phone].  Monday - Friday, call for an appointment.  Abuse/Neglect: [place] [phone] [place] [phone] (After Hours)  Emergency Shelter:  [facility] [phone]  Maternity Homes: Room at the [place] [phone] [place] [phone]  MRSA Hotline #:   403-134-1564  [facility] Dept. [address]      [place] Phone:  [phone]                                   Phone:  [phone]                 Phone:  [phone]  [facility], [place]- [phone]       -     [facility] in [place], [address]                                  [place] [phone] or [phone] (After Hours)  [place]  Substance Abuse Resources: Alcohol and Drug Services  [place] [phone] [facility] [place] [facility] [phone] Residential & Outpatient Substance Abuse Program  [phone]  Psychological  Services: [place]  [phone] [facility]  [phone] [facility], [place], [place] [address], [place], [place]: [phone] or [phone], [email or web address]  Dental Assistance  Patients with Medicaid: [place] [id number] [address] Phone:  [phone]                                                  Phone:  [phone]  If unable to pay or uninsured, contact:  [facility] or [facility]. to become qualified for the adult dental clinic.  Patients with Medicaid: [facility] [phone] [address], [place] [address] [phone]  If unable to pay, or uninsured, contact [facility] [phone]) or [place] [phone] in [place], [place] in [facility]) to become qualified for the adult dental clinic  Other [place]- [place], [place], [place], [address], [place], [place], 2nd and 4th Thursday of the month at 6:30am.  10 clients each day by appointment, can sometimes see walk-in patients if someone does not show for an appointment. [facility]- [address] Hillard Danker [place], [place], [address], [place], [place], [place], [address], [place] Department- [phone] [place] [facility] [phone]

## 2022-08-27 NOTE — ED Triage Notes (Signed)
Patient complains of bodyaches all over her body. Denies other symptoms. Patient states home covid test yesterday was negative.

## 2022-08-27 NOTE — ED Provider Notes (Signed)
[facility] EMERGENCY DEPARTMENT Provider Note   CSN: [id number] Arrival date & time: 08/27/22  0932     History  Chief Complaint  Patient presents with   Bodyaches    Barbara Meyer is a 61 y.o. female with a history of bipolar 1 disorder, anxiety, borderline personality disorder, depression, PTSD, prior gastric bypass, prior hernia repair.  Presenting today with "overall feeling unwell".  Complaining of body aches, a few episodes of diarrhea yesterday, and fatigue.  Also with mild headache yesterday, improved with Tylenol, and returned later that day.  Denies active headache.  Denies upper respiratory infections such as congestion, sore throat, cough, chest tightness.  Denies shortness of breath, chest pain, or palpitations.  Denies urinary symptoms, constipation, bloody bowel movements, abdominal pain, or N/V.  Denies neck stiffness, vision changes, fevers.  However endorses possible chills.  No recent injury.  Tearful on exam, stating "I just do not feel right, something may be wrong, I ache".  Denies SI/HI.  No other complaints at this time.  The history is provided by the patient and medical records.     Home Medications Prior to Admission medications   Medication Sig Start Date End Date Taking? Authorizing Provider  lamoTRIgine (LAMICTAL) 200 MG tablet Take 200 mg by mouth daily. 08/17/22  Yes [provider]  lithium carbonate 300 MG capsule Take 300 mg by mouth 2 (two) times daily. 08/14/22  Yes [provider]  Multiple Vitamin (MULTIVITAMIN) tablet Take 1 tablet by mouth daily.   Yes [provider]  propranolol (INDERAL) 10 MG tablet Take 10 mg by mouth 2 (two) times daily as needed (shakes). 08/14/22  Yes [provider]  QUEtiapine (SEROQUEL) 200 MG tablet Take 200 mg by mouth at bedtime. 08/02/22  Yes [provider]  traZODone (DESYREL) 100 MG tablet Take 200 mg by mouth at bedtime as needed. Patient not taking: Reported on 08/27/2022  09/07/21   [provider]      Allergies    Lisinopril    Review of Systems   Review of Systems  Constitutional:  Positive for chills and fatigue.    Physical Exam Updated Vital Signs BP (!) 140/88   Pulse 74   Temp 98.6 F (37 C)   Resp 15   Ht 5' 6.5" (1.689 m)   Wt 79.4 kg   SpO2 97%   BMI 27.82 kg/m  Physical Exam Vitals and nursing note reviewed.  Constitutional:      General: She is not in acute distress.    Appearance: She is well-developed. She is not ill-appearing, toxic-appearing or diaphoretic.  HENT:     Head: Normocephalic and atraumatic.     Nose: Nose normal.     Mouth/Throat:     Pharynx: Oropharynx is clear.  Eyes:     General: Lids are normal. Vision grossly intact. Gaze aligned appropriately. No visual field deficit.    Conjunctiva/sclera: Conjunctivae normal.     Pupils: Pupils are equal, round, and reactive to light.  Cardiovascular:     Rate and Rhythm: Normal rate and regular rhythm.     Pulses:          Radial pulses are 2+ on the right side and 2+ on the left side.       Dorsalis pedis pulses are 2+ on the right side and 2+ on the left side.       Posterior tibial pulses are 2+ on the right side and 2+ on the left side.  Heart sounds: No murmur heard. Pulmonary:     Effort: Pulmonary effort is normal. No respiratory distress.     Breath sounds: Normal breath sounds.     Comments: CTAB, able to communicate without difficulty, without increased respiratory effort.  Occasionally becomes tachypneic when anxious appearing. Chest:     Chest wall: No tenderness.  Abdominal:     General: Abdomen is protuberant. There is no distension.     Palpations: Abdomen is soft. There is no shifting dullness or pulsatile mass.     Tenderness: There is no abdominal tenderness.  Musculoskeletal:        General: No swelling.     Cervical back: Neck supple. No rigidity.     Right lower leg: No edema.     Left lower leg: No edema.  Skin:     General: Skin is warm and dry.     Capillary Refill: Capillary refill takes less than 2 seconds.     Coloration: Skin is not cyanotic, jaundiced or pale.     Findings: No erythema or rash.  Neurological:     Mental Status: She is alert and oriented to person, place, and time.     GCS: GCS eye subscore is 4. GCS verbal subscore is 5. GCS motor subscore is 6.     Cranial Nerves: No dysarthria or facial asymmetry.     Sensory: Sensation is intact.     Motor: Motor function is intact.     Coordination: Coordination is intact.     Comments: Mild tremor at baseline.  No truncal deviation.  Psychiatric:        Mood and Affect: Mood is anxious. Affect is tearful.     ED Results / Procedures / Treatments   Labs (all labs ordered are listed, but only abnormal results are displayed) Labs Reviewed  BASIC METABOLIC PANEL - Abnormal; Notable for the following components:      Result Value   Glucose, Bld 102 (*)    BUN 26 (*)    All other components within normal limits  URINALYSIS, ROUTINE W REFLEX MICROSCOPIC - Abnormal; Notable for the following components:   Leukocytes,Ua TRACE (*)    All other components within normal limits  RESP PANEL BY RT-PCR (FLU A&B, COVID) [id number]  CBC WITH DIFFERENTIAL/PLATELET    EKG EKG Interpretation  Date/Time:  Monday August 27 2022 09:51:37 EDT Ventricular Rate:  82 PR Interval:  174 QRS Duration: 87 QT Interval:  373 QTC Calculation: 436 R Axis:   -7 Text Interpretation: Sinus rhythm Low voltage, precordial leads Abnormal R-wave progression, early transition No previous ECGs available Confirmed by Fredia Sorrow [phone]) on 08/27/2022 2:12:11 PM  Radiology No results found.  Procedures Procedures    Medications Ordered in ED Medications  acetaminophen (TYLENOL) tablet 650 mg (650 mg Oral Given 08/27/22 1205)    ED Course/ Medical Decision Making/ A&P                           Medical Decision Making Amount and/or Complexity of Data  Reviewed Labs: ordered.  Risk OTC drugs.   61 y.o. female presents to the ED for concern of Bodyaches   This involves an extensive number of treatment options, and is a complaint that carries with it a high risk of complications and morbidity.  The emergent differential diagnosis prior to evaluation includes, but is not limited to: Acute covid-19 infection, anxiousness, symptomatic anemia  This is not an  exhaustive differential.   Past Medical History / Co-morbidities / Social History: Hx of bipolar type I depression, anxiety, PTSD, borderline personality disorder IDA, anal malignancy.  Hx prior gastric bypass, hernia repair, and wrist surgery. Social Determinants of Health include: Anxiety depression  Additional History:  Obtained by chart review.  Notably recent ED visits, see for details  Lab Tests: I ordered, and personally interpreted labs.  The pertinent results include:   COVID and flu negative UA, CMP, and BMP unremarkable.  Without evidence of anemia or electrolyte abnormality.  Imaging Studies: None  ED Course: Pt well-appearing on exam.  Presenting today with overall feeling unwell, due to body aches and an episode of diarrhea.  Mild headache yesterday, negative for headache today.  Afebrile.  Anxious appearing and intermittently tearful.  Mild tachypnea when anxious, relieves with relaxation and distraction.  Nontoxic, nonseptic appearing in NAD.  Extensive psychiatric history and history of anal cancer.  Without recent unintentional weight loss.  No history of thyroid disease, not bradycardic, and without enlarged or tender thyroid.  No recent travel outside the [place].  No history of rheumatic disease, or new rashes, vision changes, or clinical evidence of vasculitis.  No current use of statins or recent change to behavioral medications.  Without dehydration, polyuria, fluid retention.  In general reports feeling stressed and again anxious. Labs and EKG overall  unremarkable.  Upon reevaluation, notable improvement with Tylenol.  No other findings on physical exam.  COVID and flu negative.  Without congestion, active diarrhea, headache, or other upper respiratory symptoms.  Hemodynamically stable.  Overall, I am uncertain the exact etiology of the patient's symptoms though I suspect anxiousness may be contributory to her presentation today.  However, I do not believe she is currently experiencing a medical, surgical, or psychiatric emergency.  Recommend close follow-up with PCP, patient states she has a new appointment set up within the next couple days.  Patient in NAD and in good condition at time of discharge.  Disposition: After consideration the patient's encounter today, I do not feel today's workup suggests an emergent condition requiring admission or immediate intervention beyond what has been performed at this time.  Safe for discharge; instructed to return immediately for worsening symptoms, change in symptoms or any other concerns.  I have reviewed the patients home medicines and have made adjustments as needed.  Discussed course of treatment with the patient, whom demonstrated understanding.  Patient in agreement and has no further questions.     This chart was dictated using voice recognition software.  Despite best efforts to proofread, errors can occur which can change the documentation meaning.         Final Clinical Impression(s) / ED Diagnoses Final diagnoses:  Generalized body aches    Rx / DC Orders ED Discharge Orders     None         Prince Rome, PA-C 04/88/89 1400    Fredia Sorrow, MD 09/09/22 0003

## 2022-09-17 DIAGNOSIS — G4709 Other insomnia: Secondary | ICD-10-CM | POA: Diagnosis not present

## 2022-09-17 DIAGNOSIS — R69 Illness, unspecified: Secondary | ICD-10-CM | POA: Diagnosis not present

## 2022-09-17 DIAGNOSIS — Z79899 Other long term (current) drug therapy: Secondary | ICD-10-CM | POA: Diagnosis not present

## 2022-09-17 DIAGNOSIS — F4312 Post-traumatic stress disorder, chronic: Secondary | ICD-10-CM | POA: Diagnosis not present

## 2022-09-17 DIAGNOSIS — G4700 Insomnia, unspecified: Secondary | ICD-10-CM | POA: Diagnosis not present

## 2022-09-20 DIAGNOSIS — R197 Diarrhea, unspecified: Secondary | ICD-10-CM | POA: Diagnosis not present

## 2022-09-25 DIAGNOSIS — Z79899 Other long term (current) drug therapy: Secondary | ICD-10-CM | POA: Diagnosis not present

## 2022-09-25 DIAGNOSIS — F411 Generalized anxiety disorder: Secondary | ICD-10-CM | POA: Diagnosis not present

## 2022-09-25 DIAGNOSIS — F4312 Post-traumatic stress disorder, chronic: Secondary | ICD-10-CM | POA: Diagnosis not present

## 2022-09-25 DIAGNOSIS — G4709 Other insomnia: Secondary | ICD-10-CM | POA: Diagnosis not present

## 2022-09-25 DIAGNOSIS — R69 Illness, unspecified: Secondary | ICD-10-CM | POA: Diagnosis not present

## 2022-10-16 DIAGNOSIS — G4709 Other insomnia: Secondary | ICD-10-CM | POA: Diagnosis not present

## 2022-10-16 DIAGNOSIS — G4700 Insomnia, unspecified: Secondary | ICD-10-CM | POA: Diagnosis not present

## 2022-10-16 DIAGNOSIS — F4312 Post-traumatic stress disorder, chronic: Secondary | ICD-10-CM | POA: Diagnosis not present

## 2022-10-16 DIAGNOSIS — F419 Anxiety disorder, unspecified: Secondary | ICD-10-CM | POA: Diagnosis not present

## 2022-10-16 DIAGNOSIS — Z79899 Other long term (current) drug therapy: Secondary | ICD-10-CM | POA: Diagnosis not present

## 2022-10-16 DIAGNOSIS — R69 Illness, unspecified: Secondary | ICD-10-CM | POA: Diagnosis not present

## 2022-11-20 DIAGNOSIS — Z13220 Encounter for screening for lipoid disorders: Secondary | ICD-10-CM | POA: Diagnosis not present

## 2022-11-20 DIAGNOSIS — Z0000 Encounter for general adult medical examination without abnormal findings: Secondary | ICD-10-CM | POA: Diagnosis not present

## 2022-12-11 DIAGNOSIS — Z85048 Personal history of other malignant neoplasm of rectum, rectosigmoid junction, and anus: Secondary | ICD-10-CM | POA: Diagnosis not present

## 2023-03-05 IMAGING — CT CT ABD-PELV W/ CM
1 of 3 series · 13 of 32 positions shown, 18 images · IV contrast (APPLIED)
Comparison: None.

CLINICAL DATA: History of rectal cancer, status post chemo and
radiation.

* Tracking Code: BO *
EXAM:
CT ABDOMEN AND PELVIS WITH CONTRAST
TECHNIQUE: Multidetector CT imaging of the abdomen and pelvis was performed
using the standard protocol following bolus administration of
intravenous contrast.

[Series 2: abd/pelvis w/cm · axial · 0.84mm/px · z∈[-502,-82]mm · 13 of 96 slices shown, 18 images]
[im 6/96  soft-tissue]
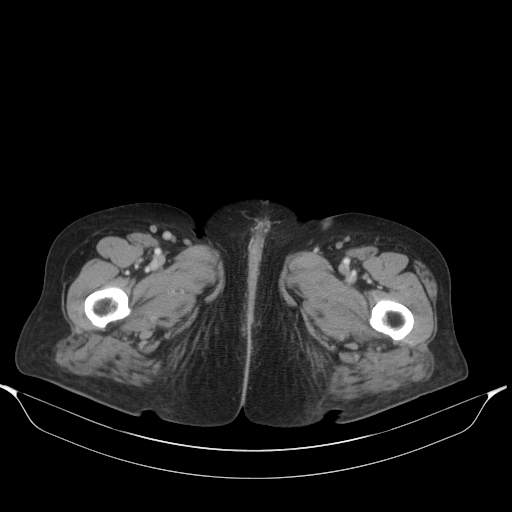
[im 6/96  bone]
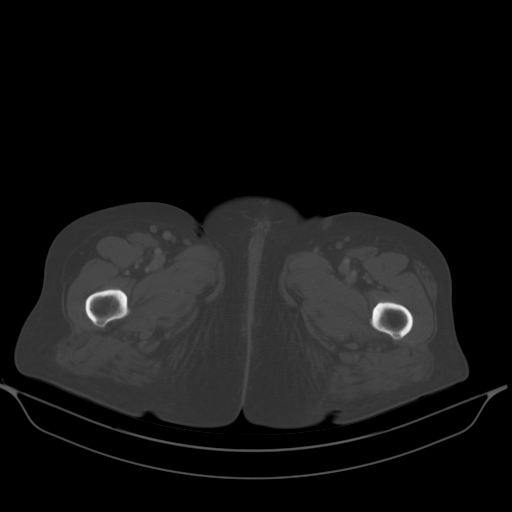
[im 12/96  soft-tissue]
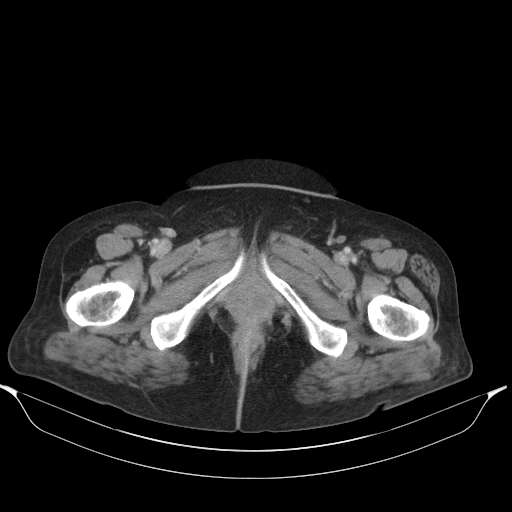
[im 24/96  soft-tissue]
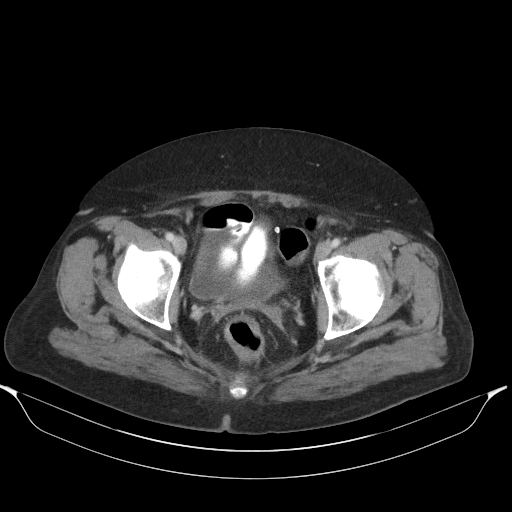
[im 30/96  soft-tissue]
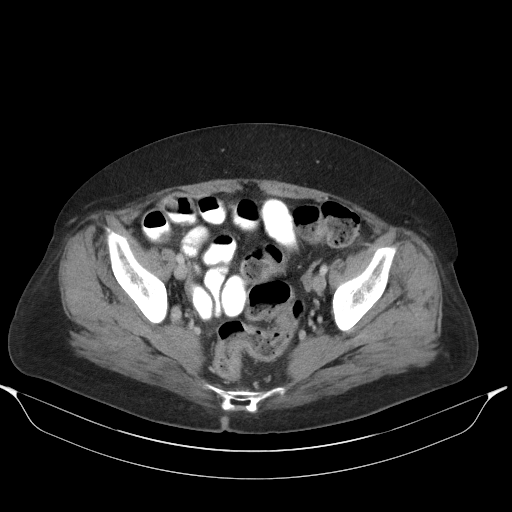
[im 36/96  soft-tissue]
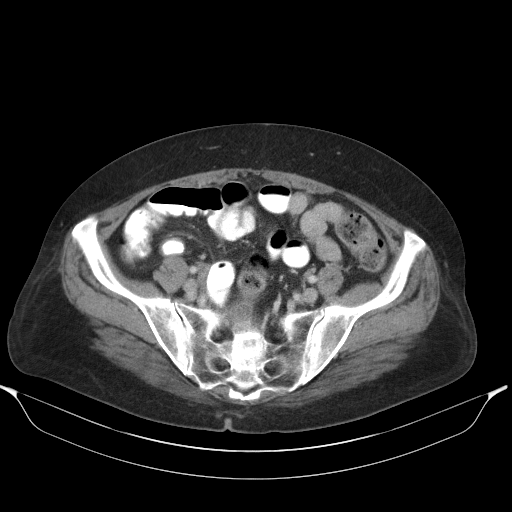
[im 42/96  soft-tissue]
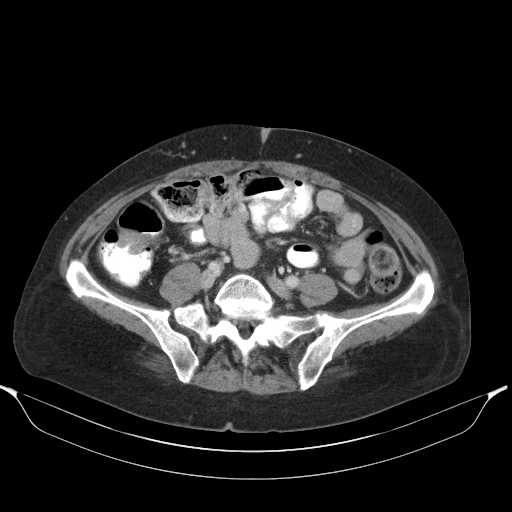
[im 54/96  soft-tissue]
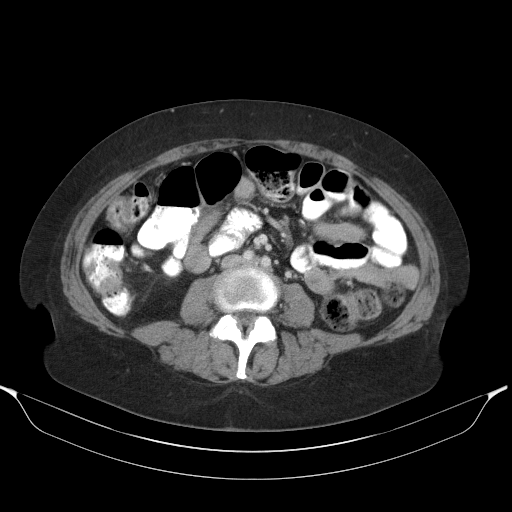
[im 60/96  soft-tissue]
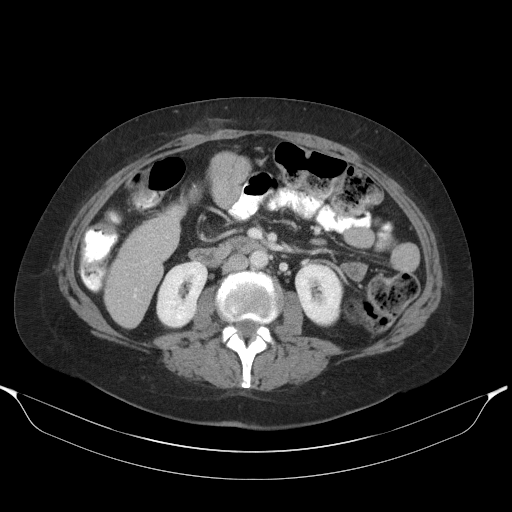
[im 66/96  soft-tissue]
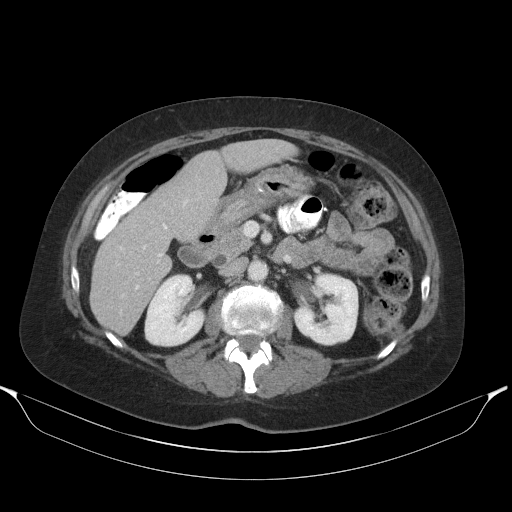
[im 66/96  bone]
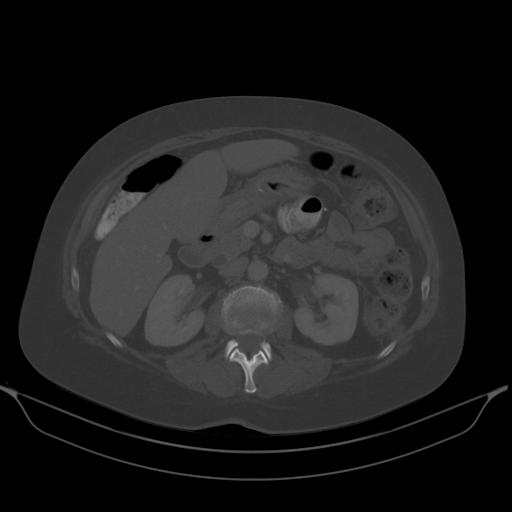
[im 72/96  soft-tissue]
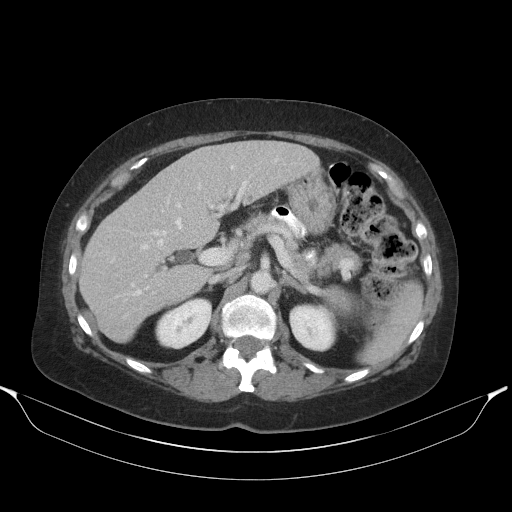
[im 72/96  lung]
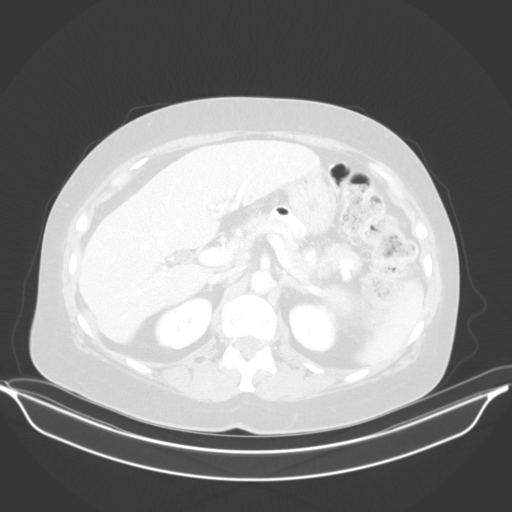
[im 78/96  lung]
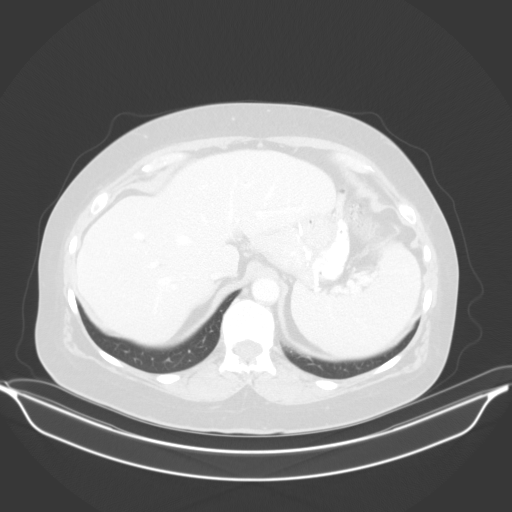
[im 84/96  soft-tissue]
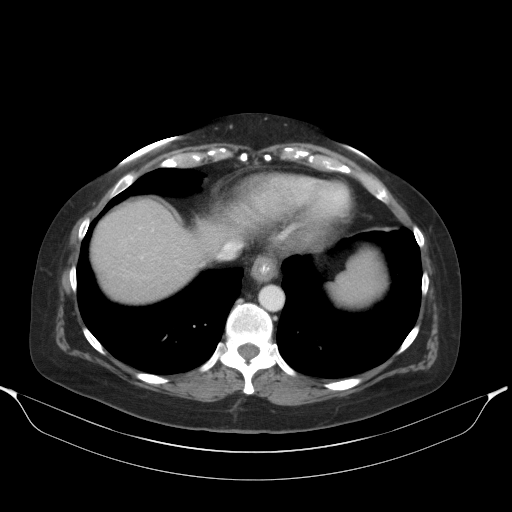
[im 84/96  lung]
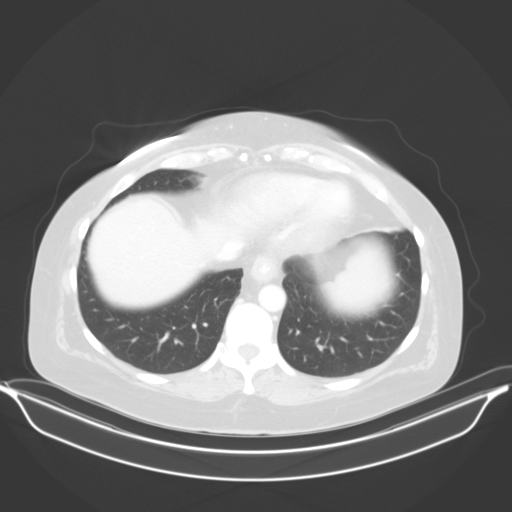
[im 90/96  soft-tissue]
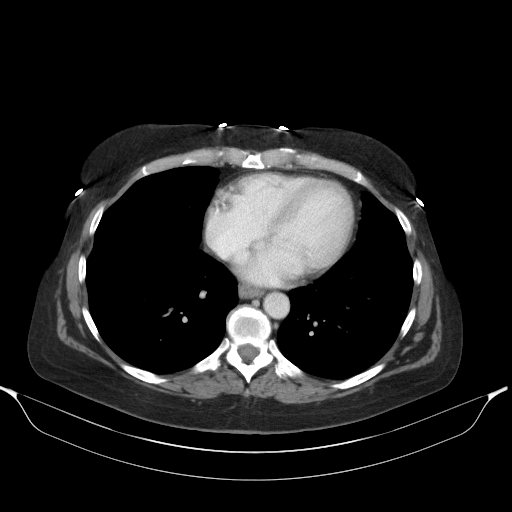
[im 90/96  lung]
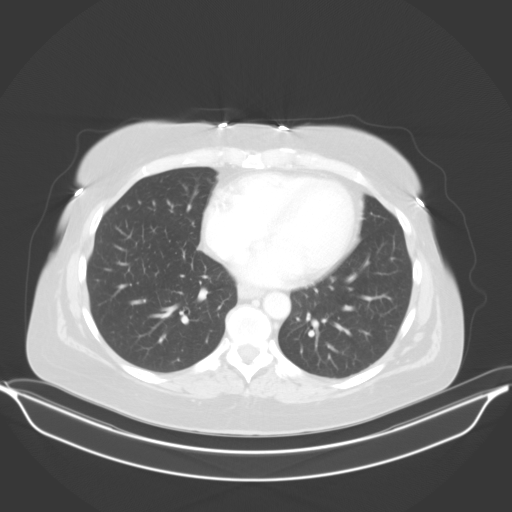

[13 of 32 positions shown; findings below may reference images not displayed]

RADIATION DOSE REDUCTION: This exam was performed according to the
departmental dose-optimization program which includes automated
exposure control, adjustment of the mA and/or kV according to
patient size and/or use of iterative reconstruction technique.

CONTRAST:  80mL XJRBCY-9C8 IOPAMIDOL (XJRBCY-9C8) INJECTION 76%
FINDINGS: Lower chest: No suspicious pulmonary nodules in the lung bases. No
acute abnormality.

Hepatobiliary: No suspicious hepatic lesions. Nodular hepatic
contour. Gallbladder is surgically absent. There is prominence of
the biliary tree with the common duct measuring 9 mm, likely
reflecting reservoir effect post cholecystectomy however in the
setting of no prior imaging for comparison would consider
correlation with laboratory values to exclude obstruction.

Pancreas: No pancreatic ductal dilation or evidence of acute
inflammation.

Spleen: No splenomegaly or focal splenic lesion.

Adrenals/Urinary Tract: Bilateral adrenal glands appear normal. No
hydronephrosis. Kidneys demonstrate symmetric enhancement and
excretion of contrast material. Urinary bladder is unremarkable for
degree of distension.

Stomach/Bowel: Mild asymmetric rectal wall thickening with stranding
in the mesorectal fat for instance on image 73-74/2 possibly
reflecting patient's treated rectal cancer.

Radiopaque enteric contrast material was administered. Postsurgical
changes of gastric bypass without evidence of complication. No
pathologic dilation of large or small bowel. The appendix and
terminal ileum appear normal. Moderate volume of formed stool
throughout the colon. No evidence of acute bowel inflammation.

Vascular/Lymphatic: Aortic atherosclerosis without abdominal aortic
aneurysm. No pathologically enlarged abdominal or pelvic lymph
nodes.

Reproductive: Status post hysterectomy. No adnexal masses.

Other: No discrete peritoneal or omental nodularity. Mild presacral
thickening/fluid is favored to reflect post treatment change. No
significant abdominopelvic free fluid.

Musculoskeletal: No aggressive lytic or blastic lesion of bone.
IMPRESSION: 1. Mild asymmetric rectal wall thickening with stranding in the
mesorectal fat possibly reflecting patient's treated rectal cancer.
2. No evidence of metastatic disease within the abdomen or pelvis.
3. Mild presacral fluid/thickening is favored to reflect post
treatment change.
4. Nodular hepatic contour suggestive of cirrhosis.
5. Prominent biliary tree with the common duct measuring 9 mm,
likely reflecting reservoir effect post cholecystectomy however in
the setting of no prior imaging for comparison would consider
correlation with laboratory values to exclude obstruction.
6. Moderate volume of formed stool throughout the colon. Correlate
for constipation.
7. Aortic Atherosclerosis ([id number]-J54.4).

## 2023-04-12 ENCOUNTER — Inpatient Hospital Stay: Payer: Medicaid Other

## 2023-04-15 ENCOUNTER — Other Ambulatory Visit: Payer: 59

## 2023-04-19 ENCOUNTER — Inpatient Hospital Stay: Admission: RE | Admit: 2023-04-19 | Payer: 59 | Source: Ambulatory Visit

## 2023-04-25 ENCOUNTER — Inpatient Hospital Stay: Payer: Medicaid Other | Admitting: Hematology

## 2023-05-11 ENCOUNTER — Other Ambulatory Visit: Payer: Self-pay

## 2023-05-11 ENCOUNTER — Emergency Department (HOSPITAL_COMMUNITY)
Admission: EM | Admit: 2023-05-11 | Discharge: 2023-05-11 | Disposition: A | Discharge status: Home / Self Care | Payer: Medicaid Other | Attending: Emergency Medicine | Admitting: Emergency Medicine

## 2023-05-11 ENCOUNTER — Encounter (HOSPITAL_COMMUNITY): Payer: Self-pay

## 2023-05-11 DIAGNOSIS — Z760 Encounter for issue of repeat prescription: Secondary | ICD-10-CM

## 2023-05-11 DIAGNOSIS — N760 Acute vaginitis: Secondary | ICD-10-CM | POA: Diagnosis present

## 2023-05-11 MED ORDER — QUETIAPINE FUMARATE 200 MG PO TABS: 200.0000 mg | ORAL_TABLET | Freq: Every day | ORAL | 0 refills | Status: DC

## 2023-05-11 NOTE — Discharge Instructions (Addendum)
I have sent a 30-day supply of Seroquel to your pharmacy.  I have also attached a psychiatrist in [place] who could possibly see you in the future for your psychiatric needs.  Her contact information is in your discharge summary.

## 2023-05-11 NOTE — ED Triage Notes (Signed)
Pt needs her seroquel meds filled. Pt in between providers and wants it filled.

## 2023-05-11 NOTE — ED Provider Notes (Signed)
[place] EMERGENCY DEPARTMENT AT [facility] Provider Note   CSN: [id number] Arrival date & time: 05/11/23  1205     History  Chief Complaint  Patient presents with   Needs prescription filled   HPI Barbara Meyer is a 62 y.o. female with history of bipolar 1 disorder, depression and PTSD presenting for refill of her Seroquel.  States she recently got a new job with new insurance.  Was advised by her psychiatrist that her new insurance would not be excepted at their office and for that reason she would be unable to refill her Seroquel.  Patient states she only has a few tablets left.  She has been on the same dose which is 200 mg once daily for the last year.  Is also hoping to establish care with a new psychiatrist.  Has no other medical complaints at this time.  HPI     Home Medications Prior to Admission medications   Medication Sig Start Date End Date Taking? Authorizing Provider  QUEtiapine (SEROQUEL) 200 MG tablet Take 1 tablet (200 mg total) by mouth at bedtime. 05/11/23 06/10/23 Yes Gareth Eagle, PA-C  lamoTRIgine (LAMICTAL) 200 MG tablet Take 200 mg by mouth daily. 08/17/22   [provider]  lithium carbonate 300 MG capsule Take 300 mg by mouth 2 (two) times daily. 08/14/22   [provider]  Multiple Vitamin (MULTIVITAMIN) tablet Take 1 tablet by mouth daily.    [provider]  propranolol (INDERAL) 10 MG tablet Take 10 mg by mouth 2 (two) times daily as needed (shakes). 08/14/22   [provider]  traZODone (DESYREL) 100 MG tablet Take 200 mg by mouth at bedtime as needed. Patient not taking: Reported on 08/27/2022 09/07/21   [provider]      Allergies    Lisinopril    Review of Systems   See HPI for pertinent positives  Physical Exam Updated Vital Signs BP (!) 150/79 (BP Location: Right Arm)   Pulse 74   Temp 98.2 F (36.8 C) (Oral)   Resp 18   Ht 5' 6.5" (1.689 m)   Wt 81.6 kg   SpO2 97%   BMI  28.62 kg/m  Physical Exam Constitutional:      Appearance: Normal appearance.  HENT:     Head: Normocephalic.     Nose: Nose normal.  Eyes:     Conjunctiva/sclera: Conjunctivae normal.  Pulmonary:     Effort: Pulmonary effort is normal.  Neurological:     Mental Status: She is alert.  Psychiatric:        Mood and Affect: Mood normal.     ED Results / Procedures / Treatments   Labs (all labs ordered are listed, but only abnormal results are displayed) Labs Reviewed - No data to display  EKG None  Radiology No results found.  Procedures Procedures    Medications Ordered in ED Medications - No data to display  ED Course/ Medical Decision Making/ A&P                             Medical Decision Making  62 year old well-appearing female presenting for medication refill.  Exam is unremarkable.  Patient has been stable on 200 mg daily of Seroquel for the last year.  Sent a 30-day supply to her pharmacy.  Also submitted a referral to [facility] psychiatry.  Vitals stable at discharge.  Discharged home.  Final Clinical Impression(s) / ED Diagnoses Final diagnoses:  Medication refill    Rx / DC Orders ED Discharge Orders          Ordered    QUEtiapine (SEROQUEL) 200 MG tablet  Daily at bedtime        05/11/23 1342              Gareth Eagle, PA-C 05/11/23 1344    Terrilee Files, MD 05/11/23 1740

## 2023-07-01 ENCOUNTER — Ambulatory Visit (INDEPENDENT_AMBULATORY_CARE_PROVIDER_SITE_OTHER): Payer: Medicaid Other | Admitting: Psychiatry

## 2023-07-01 ENCOUNTER — Encounter (HOSPITAL_COMMUNITY): Payer: Self-pay | Admitting: Psychiatry

## 2023-07-01 DIAGNOSIS — F332 Major depressive disorder, recurrent severe without psychotic features: Secondary | ICD-10-CM

## 2023-07-01 DIAGNOSIS — G4709 Other insomnia: Secondary | ICD-10-CM

## 2023-07-01 DIAGNOSIS — F410 Panic disorder [episodic paroxysmal anxiety] without agoraphobia: Secondary | ICD-10-CM

## 2023-07-01 DIAGNOSIS — I10 Essential (primary) hypertension: Secondary | ICD-10-CM | POA: Insufficient documentation

## 2023-07-01 DIAGNOSIS — F411 Generalized anxiety disorder: Secondary | ICD-10-CM | POA: Diagnosis not present

## 2023-07-01 DIAGNOSIS — F603 Borderline personality disorder: Secondary | ICD-10-CM

## 2023-07-01 DIAGNOSIS — Z87891 Personal history of nicotine dependence: Secondary | ICD-10-CM

## 2023-07-01 DIAGNOSIS — K7460 Unspecified cirrhosis of liver: Secondary | ICD-10-CM | POA: Insufficient documentation

## 2023-07-01 DIAGNOSIS — E559 Vitamin D deficiency, unspecified: Secondary | ICD-10-CM

## 2023-07-01 DIAGNOSIS — Z79899 Other long term (current) drug therapy: Secondary | ICD-10-CM

## 2023-07-01 DIAGNOSIS — F319 Bipolar disorder, unspecified: Secondary | ICD-10-CM | POA: Diagnosis not present

## 2023-07-01 DIAGNOSIS — Z8619 Personal history of other infectious and parasitic diseases: Secondary | ICD-10-CM | POA: Insufficient documentation

## 2023-07-01 HISTORY — DX: Other long term (current) drug therapy: Z79.899

## 2023-07-01 MED ORDER — PROPRANOLOL HCL 10 MG PO TABS: 10.0000 mg | ORAL_TABLET | Freq: Two times a day (BID) | ORAL | 2 refills | Status: DC | PRN

## 2023-07-01 MED ORDER — LITHIUM CARBONATE 300 MG PO CAPS: 300.0000 mg | ORAL_CAPSULE | Freq: Every evening | ORAL | 1 refills | Status: DC

## 2023-07-01 MED ORDER — QUETIAPINE FUMARATE 200 MG PO TABS: 200.0000 mg | ORAL_TABLET | Freq: Every day | ORAL | 2 refills | Status: DC

## 2023-07-01 MED ORDER — FLUOXETINE HCL 10 MG PO CAPS: 10.0000 mg | ORAL_CAPSULE | Freq: Every day | ORAL | 1 refills | Status: DC

## 2023-07-01 MED ORDER — LAMOTRIGINE 200 MG PO TABS: 200.0000 mg | ORAL_TABLET | Freq: Every day | ORAL | 2 refills | Status: DC

## 2023-07-01 NOTE — Patient Instructions (Addendum)
We added Prozac (fluoxetine) 10 mg once daily to her regimen today.  Start taking this tomorrow when you are off of work.  We will keep your other medications the same for now but we will plan on discontinuing the lithium as our most likely next target.  Please coordinate with your PCP to get an updated lithium level, TSH with total T3 and free T4, CMP, lipid panel, A1c, and EKG.  The latter 3 are for monitoring while you are on a medication like Seroquel.  To help with your anxiety and sleep try to cut out after 12 PM caffeine.

## 2023-07-01 NOTE — Progress Notes (Signed)
Psychiatric Initial Adult Assessment  Patient Identification: Barbara Meyer MRN:  [id number] Date of Evaluation:  07/01/2023 Referral Source: PCP  Assessment:  Barbara Meyer is a 62 y.o. 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  Patient reported past heavy use of both alcohol and methamphetamines during the same timeframe in which she received a bipolar 1 diagnosis many years ago.  Unfortunately do not have records from that time when she was living in [place] having only briefly moved to [place] in roughly 2022.  She carried a historical diagnosis of bipolar 1 disorder as well as borderline personality disorder and PTSD but at time of initial appointment did not meet symptom burden of PTSD or bipolar disorder.  Based on substance use history it is far more likely that substance-induced mood disorder was more accurate but will continue to assess over time to make sure she does not have bipolar 1 disorder.  With this in mind she also was unable to say why she was on the medication regimen that she presented with but regardless it did not appear to be helping her overall levels of depression and anxiety.  She was extremely dysphoric during assessment and frequently tearful with some hopelessness around her current state.  She had many panic attacks throughout the week which seem to be getting worse with a possible  promotion at work.  She had some concerns that her memory was impaired which could be more acutely reflective of the amount of Imodium and atropine containing antidiarrheals that she was taking well beyond the prescribed norm.  She has an upcoming appointment with GI but encouraged her to reach out to PCP in the meantime as she was also taking Pepto-Bismol on top of this.  Ultimately with PCP to get updated monitoring labs while on long-term lithium, Seroquel, Lamictal and try to check a vitamin D level.  She previously had a gastric bypass surgery but has put on 75 pounds since moving to [place] and appears to be frequently binge eating.  Started Prozac as next agent is as good data for binge eating disorder as well as generalized anxiety with panic and depressive disorder.  We will plan on discontinuing lithium as next agent as at its current dose it is unlikely to be providing much benefit anyway.  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  Have placed referral for psychotherapy.  Follow-up in 1 month.  For safety, her acute risk factors for suicide are: Diagnosis of borderline personality disorder, current diagnosis of depression, self-harm by scratching.  Her chronic risk factors for suicide are: History of substance use, history of alcohol use disorder, historical diagnosis of bipolar disorder, diagnosed borderline personality disorder, social isolation.  Her protective factors are: Employment, supportive family, beloved pets in the home, actively seeking and engaging with mental health care no suicidal ideation, no access to firearms.  While future events cannot be fully predicted she does  not currently meet IVC criteria and can be continued as an outpatient.  Plan:  # Borderline personality disorder  historical diagnosis of bipolar 1 disorder rule out substance induced from history of  methamphetamine and alcohol use disorder Past medication trials: See medication trials below Status of problem: New to provider Interventions: -- Continue Seroquel 200 mg nightly for now --Continue lithium 300 mg nightly for now --Continue Lamictal 200 mg daily for now --Continue to assess for accuracy of bipolar diagnosis  # Major depressive disorder, recurrent severe without psychotic features Past medication trials:  Status of problem: New to provider Interventions: -- Seroquel, lithium, Lamictal as above --Psychotherapy referral --Start Prozac 10 mg once daily (s7/8/24)  # Generalized anxiety disorder with panic attacks Past medication trials:  Status of problem: New to provider Interventions: -- Prozac, Seroquel, psychotherapy as above --Continue propranolol 10 mg twice daily as needed for panic  # Psychophysiologic insomnia with p.m. caffeine use and history of snoring Past medication trials:  Status of problem: New to provider Interventions: -- Patient to cut back on caffeine use --Seroquel as above  # History of vitamin D and iron deficiency with history of gastric bypass surgery Past medication trials:  Status of problem: New to provider Interventions: -- Coordinate with PCP for vitamin D level and consider nutrition referral  # High risk medication use: Lithium and Lamictal Past medication trials:  Status of problem: New to provider Interventions: -- Coordinate with PCP to get updated lithium level, CMP, TSH with total T3 and free T4  # Long-term current use of antipsychotic: Seroquel Past medication trials:  Status of problem: New to provider Interventions: -- Coordinate with PCP to get updated lipid panel, A1c, EKG  # History of tobacco use disorder in sustained remission Past medication trials:  Status of problem: New to provider Interventions: -- Continue to encourage abstinence  Patient was given contact information for behavioral health clinic  and was instructed to call 911 for emergencies.   Subjective:  Chief Complaint:  Chief Complaint  Patient presents with   Depression   Anxiety   Establish Care   Panic Attack   Stress    History of Present Illness:  Goes by Barbara Meyer. Was looking for mental health provider to manage her medication. Has lived in [place] for 2 years with her sister and brother in law; had lived in [place] but after getting rectal cancer moved. When insurance changed couldn't be seen by [facility] anymore. Lithium is 300mg  nightly, lamictal 200mg  daily, propranolol 10mg  bid prn for anxiety, seroquel 200mg  at bedtime, and trazodone no longer taking.   Lives with sister and brother in law, 2 dogs. Everyone getting along. For fun, doesn't do anything. Works at [employer] and then comes homes to her room and watching tv. Doesn't have any friends here. Was also this way in [place], even when living with her best friend. In her room with the blinds shut. Has been this way for 8-10 years. Gets up and down a lot after going to bed. Will night snack as well. 8-10 hrs per night. Doesn't feel rested. 1 cup of coffee per day and 1 soda or 2 at work. Used to drink coffee throughout the day. No strong urge to move legs. No vivid dreams or nightmares. Used to snore, used to use a CPAP but after gastric bypass surgery hasn't used it anymore. Appetite is roughly 2 small meals per day. Since moving, has put on weight from 125lbs to 200lbs. No purging. Binge  episodes happen more than 4/7 days per week; eating when no longer hungry and thinks she has stretched out her stomach again; will get stomachaches. Denies intentional restriction. 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 Doesn't find that concentration and memory are as good as she used. Able to do days of the week backwards. Remembers 3/3 words at 2 minutes. Struggles with guilt feelings. Sometimes wonders if life is worth living but never comes up  with a plan or having intent to hurt herself. Is tired of how things are going; regrets moving here and misses her children and grandchildren.   Chronic worry across multiple domains with impact on sleep and muscle tension. Panic attacks occur a couple times per week. Usually has one when going to work. No periods of sleeplessness or excess energy. No hallucinations. No paranoia.   No alcohol at present, has been 25 years since last drink as a recovering alcoholic since 1999. No tobacco products since in roughly 2002. No other drugs at present. Quit methamphetamine in 1999 as well; used IV and was treated for hepatitis C. Seeing Sanda Klein at [facility].    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  Past Medical History:  Past Medical History:  Diagnosis Date   Anxiety    Bipolar 1 disorder (HCC)    Borderline personality disorder (HCC)    Depression    PTSD (post-traumatic stress disorder)     Past Surgical History:  Procedure Laterality Date   GASTRIC BYPASS  10/2019   HERNIA REPAIR  2022   hernia repair and mass removal   WRIST  FRACTURE SURGERY Left 12/20/2020    Family Psychiatric History: son on medication for bipolar and depression, mother/father alcoholic  Family History:  Family History  Problem Relation Age of Onset   Brain cancer Brother    Rectal cancer Maternal Uncle        met to bones    Social History:   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  Social History   Socioeconomic History   Marital status: Single    Spouse name: Not on file   Number of children: Not on file   Years of education: Not on file   Highest education level: Not on file  Occupational History   Not on file  Tobacco Use   Smoking status: Former    Types: Cigarettes    Quit date: 2002    Years since quitting: 22.5   Smokeless tobacco: Never  Vaping Use   Vaping Use: Never used  Substance and Sexual Activity   Alcohol use: Not Currently    Comment: Sober since 1999   Drug use: Not Currently    Types: Methamphetamines    Comment: Sober since 1999   Sexual activity: Not Currently  Other Topics Concern   Not on file  Social History Narrative   Not on file   Social Determinants of Health   Financial Resource Strain: Low Risk  (09/20/2021)   Overall Financial Resource Strain (CARDIA)    Difficulty of Paying Living Expenses: Not hard at all  Food Insecurity: No Food Insecurity (09/20/2021)   Hunger Vital Sign    Worried About Running Out of Food in the Last Year: Never true    Ran Out of Food in the Last Year: Never true  Transportation Needs: No Transportation Needs (09/20/2021)   PRAPARE - Administrator, Civil Service (Medical): No    Lack of Transportation (Non-Medical): No  Physical Activity: Inactive (09/20/2021)   Exercise Vital Sign    Days of Exercise per Week: 0 days    Minutes of Exercise per Session: 0 min  Stress: Stress Concern Present (09/20/2021)   [employer] of Occupational Health - Occupational Stress Questionnaire    Feeling of Stress : To some extent  Social Connections:  Moderately Isolated (09/20/2021)   Social Connection and Isolation Panel [NHANES]    Frequency of Communication with Friends and Family: More than three times a week    Frequency of Social Gatherings with Friends and Family: More than three times a week    Attends Religious Services: More than 4 times per year    Active Member of [employer] or Organizations: No    Attends Banker Meetings: Never    Marital Status: Never married    Additional Social History: updated  Allergies:   Allergies  Allergen Reactions   Lisinopril Cough    Current Medications: Current Outpatient Medications  Medication Sig Dispense Refill   bismuth subsalicylate (PEPTO BISMOL) 262 MG/15ML suspension Take 30 mLs by mouth 3 (three) times daily as needed for diarrhea or loose stools.     diphenoxylate-atropine (LOMOTIL) 2.5-0.025 MG tablet Take 1 tablet by mouth 4 (four) times daily as needed for diarrhea or loose stools.     FLUoxetine (PROZAC) 10 MG capsule Take 1 capsule (10 mg total) by mouth daily. 30 capsule 1   loperamide (IMODIUM A-D) 2 MG tablet Take 2 mg by mouth 3 (three) times daily as needed for diarrhea or loose stools.     lamoTRIgine (LAMICTAL) 200 MG tablet Take 1 tablet (200 mg total) by mouth daily. 30 tablet 2   lithium carbonate 300 MG capsule Take 1 capsule (300 mg total) by mouth at bedtime. 30 capsule 1   Multiple Vitamin (MULTIVITAMIN) tablet Take 1 tablet by mouth daily.     propranolol (INDERAL) 10 MG tablet Take 1 tablet (10 mg total) by mouth 2 (two) times daily as needed (Anxiety/panic). 60 tablet 2   QUEtiapine (SEROQUEL) 200 MG tablet Take 1 tablet (200 mg total) by mouth at bedtime. 30 tablet 2   No current facility-administered medications for this visit.    ROS: Review of Systems  Constitutional:  Positive for appetite change and unexpected weight change.  Gastrointestinal:  Positive for diarrhea. Negative for constipation, nausea and vomiting.  Endocrine: Positive  for cold intolerance, heat intolerance and polyphagia.  Musculoskeletal:  Negative for arthralgias and back pain.  Skin:        No hair loss  Neurological:  Positive for headaches. Negative for dizziness.  Psychiatric/Behavioral:  Positive for decreased concentration, dysphoric mood and sleep disturbance. Negative for hallucinations, self-injury and suicidal ideas. The patient is nervous/anxious. The patient is not hyperactive.  Objective:  Psychiatric Specialty Exam: There were no vitals taken for this visit.There is no height or weight on file to calculate BMI.  General Appearance: Casual, Fairly Groomed, and appears stated age.  Has dentures  Eye Contact:  Fair  Speech:  Clear and Coherent and Normal Rate  Volume:  Normal  Mood:   "I cannot keep feeling like this"  Affect:  Appropriate, Congruent, Depressed, Tearful, and very anxious  Thought Content: Logical and Hallucinations: None   Suicidal Thoughts:   No but has some hopelessness  Homicidal Thoughts:  No  Thought Process:  Coherent, Goal Directed, and Linear  Orientation:  Full (Time, Place, and Person)    Memory: Remembers 3 out of 3 words at 2 minutes  Judgment:  Fair  Insight:  Fair  Concentration:  Concentration: Fair  Recall: As above  Fund of Knowledge: Fair  Language: Fair  Psychomotor Activity:  Increased and constantly moving  Akathisia:  No  AIMS (if indicated): not done  Assets:  Manufacturing systems engineer Desire for Improvement Financial Resources/Insurance Housing Leisure Time Resilience Social Support Talents/Skills Transportation Vocational/Educational  ADL's:  Intact  Cognition: WNL  Sleep:  Poor   PE: General: sits comfortably in view of camera; actively crying at times Pulm: no increased work of breathing on room air  MSK: all extremity movements appear intact  Neuro: no focal neurological deficits observed  Gait & Station: unable to assess by video    Metabolic Disorder Labs: No results  found for: "HGBA1C", "MPG" No results found for: "PROLACTIN" No results found for: "CHOL", "TRIG", "HDL", "CHOLHDL", "VLDL", "LDLCALC" No results found for: "TSH"  Therapeutic Level Labs: No results found for: "LITHIUM" No results found for: "CBMZ" No results found for: "VALPROATE"  Screenings:  PHQ2-9    Flowsheet Row Office Visit from 07/01/2023 in [facility] [place] Office Visit from 09/20/2021 in [facility]  PHQ-2 Total Score 6 2  PHQ-9 Total Score 18 6      Flowsheet Row Office Visit from 07/01/2023 in [facility] [place] ED from 05/11/2023 in [facility] at [facility] ED from 08/27/2022 in [facility] at [facility]  C-SSRS RISK CATEGORY No Risk No Risk No Risk       Collaboration of Care: Collaboration of Care: Medication Management AEB as above, Primary Care Provider AEB as above, and Referral or follow-up with counselor/therapist AEB as above  Patient/Guardian was advised Release of Information must be obtained prior to any record release in order to collaborate their care with an outside provider. Patient/Guardian was advised if they have not already done so to contact the registration department to sign all necessary forms in order for [place] to release information regarding their care.   Consent: Patient/Guardian gives verbal consent for treatment and assignment of benefits for services provided during this visit. Patient/Guardian expressed understanding and agreed to proceed.   Televisit via video: I connected with Isa Rankin on 07/01/23 at  2:00 PM EDT by a video enabled telemedicine application and verified that I am speaking with the correct person using two identifiers.  Location: Patient: home in [place] Provider: home office   I discussed the limitations of evaluation and management by telemedicine  and the availability of in person appointments. The patient expressed understanding and agreed to proceed.  I discussed the assessment and treatment plan with the patient. The patient was provided an opportunity  to ask questions and all were answered. The patient agreed with the plan and demonstrated an understanding of the instructions.   The patient was advised to call back or seek an in-person evaluation if the symptoms worsen or if the condition fails to improve as anticipated.  I provided 60 minutes of non-face-to-face time during this encounter.  Elsie Lincoln, MD 7/8/20245:15 PM

## 2023-07-24 ENCOUNTER — Other Ambulatory Visit (HOSPITAL_COMMUNITY): Payer: Self-pay | Admitting: Psychiatry

## 2023-07-24 DIAGNOSIS — F319 Bipolar disorder, unspecified: Secondary | ICD-10-CM

## 2023-07-24 DIAGNOSIS — F410 Panic disorder [episodic paroxysmal anxiety] without agoraphobia: Secondary | ICD-10-CM

## 2023-07-24 DIAGNOSIS — F603 Borderline personality disorder: Secondary | ICD-10-CM

## 2023-07-24 DIAGNOSIS — F332 Major depressive disorder, recurrent severe without psychotic features: Secondary | ICD-10-CM

## 2023-08-01 ENCOUNTER — Telehealth: Payer: Self-pay

## 2023-08-01 NOTE — Telephone Encounter (Signed)
went online to [email or web address] and submited the prior auth- pending

## 2023-08-01 NOTE — Telephone Encounter (Signed)
prior Berkley Harvey was approved from 08-01-23 to 07-31-24 Prior Auth# [id number]

## 2023-08-01 NOTE — Telephone Encounter (Signed)
received fax that a prior auth was needed for quetiapine

## 2023-08-06 ENCOUNTER — Encounter (HOSPITAL_COMMUNITY): Payer: Self-pay | Admitting: Psychiatry

## 2023-08-06 ENCOUNTER — Telehealth (INDEPENDENT_AMBULATORY_CARE_PROVIDER_SITE_OTHER): Payer: Medicaid Other | Admitting: Psychiatry

## 2023-08-06 DIAGNOSIS — Z9884 Bariatric surgery status: Secondary | ICD-10-CM

## 2023-08-06 DIAGNOSIS — F4310 Post-traumatic stress disorder, unspecified: Secondary | ICD-10-CM

## 2023-08-06 DIAGNOSIS — F17201 Nicotine dependence, unspecified, in remission: Secondary | ICD-10-CM

## 2023-08-06 DIAGNOSIS — F411 Generalized anxiety disorder: Secondary | ICD-10-CM | POA: Diagnosis not present

## 2023-08-06 DIAGNOSIS — E559 Vitamin D deficiency, unspecified: Secondary | ICD-10-CM

## 2023-08-06 DIAGNOSIS — Z8659 Personal history of other mental and behavioral disorders: Secondary | ICD-10-CM

## 2023-08-06 DIAGNOSIS — F5104 Psychophysiologic insomnia: Secondary | ICD-10-CM

## 2023-08-06 DIAGNOSIS — F1590 Other stimulant use, unspecified, uncomplicated: Secondary | ICD-10-CM

## 2023-08-06 DIAGNOSIS — F603 Borderline personality disorder: Secondary | ICD-10-CM

## 2023-08-06 DIAGNOSIS — F332 Major depressive disorder, recurrent severe without psychotic features: Secondary | ICD-10-CM | POA: Diagnosis not present

## 2023-08-06 DIAGNOSIS — F319 Bipolar disorder, unspecified: Secondary | ICD-10-CM

## 2023-08-06 DIAGNOSIS — Z79899 Other long term (current) drug therapy: Secondary | ICD-10-CM

## 2023-08-06 DIAGNOSIS — Z8709 Personal history of other diseases of the respiratory system: Secondary | ICD-10-CM

## 2023-08-06 DIAGNOSIS — G4709 Other insomnia: Secondary | ICD-10-CM

## 2023-08-06 DIAGNOSIS — F410 Panic disorder [episodic paroxysmal anxiety] without agoraphobia: Secondary | ICD-10-CM

## 2023-08-06 DIAGNOSIS — Z8639 Personal history of other endocrine, nutritional and metabolic disease: Secondary | ICD-10-CM

## 2023-08-06 NOTE — Progress Notes (Signed)
BH MD Outpatient Progress Note  08/06/2023 3:56 PM Barbara Meyer  MRN:  [id number]  Assessment:  Barbara Meyer presents for follow-up evaluation. Today, 08/06/23, patient reports slight improvement to anxiety and no longer feeling like she needs to cry daily with addition of Prozac.  Her preference is to maintain current dose for now and give the full 6-8 weeks to see if further benefits can be gained.  She did switch from morning to night dose of Prozac and had significantly improved sleep with it sleeping about 10 hours a night.  Was able to get a lithium level that was drawn by gastroenterologist and found to be 0.4 but as previously discussed much lower likelihood that she has a bipolar spectrum of illness and much more likely this is a primary borderline personality disorder which would not require higher doses of lithium.  She was unable to get the blood work requested and will try to do so before next appointment for baseline monitoring while on lithium and Seroquel.  Binge eating still roughly the same but self-harm slightly improved with not breaking the skin while scratching; does report tactile sensation of something crawling on her which will need to continue to be monitored.  Follow-up in 1 month.   For safety, her acute risk factors for suicide are: Diagnosis of borderline personality disorder, current diagnosis of depression, self-harm by scratching.  Her chronic risk factors for suicide are: History of substance use, history of alcohol use disorder, historical diagnosis of bipolar disorder, diagnosed borderline personality disorder, social isolation.  Her protective factors are: Employment, supportive family, beloved pets in the home, actively seeking and engaging with mental health care no suicidal ideation, no access to firearms.  While future events cannot be fully predicted she does not currently meet IVC criteria and can be continued as an outpatient.  Identifying  Information: Barbara Meyer is a 62 y.o. 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 Initial evaluation of depression and anxiety on 07/01/23; please see that note for full case formulation.  Patient reported past heavy use of both alcohol and methamphetamines during the same timeframe in which she received a bipolar 1 diagnosis many years ago.  Unfortunately do not have records from that time when she was living in [place] having only briefly moved to [place] in roughly 2022.  She carried a historical diagnosis of bipolar 1 disorder as well as borderline personality disorder and PTSD but at time of initial appointment did not meet symptom burden of PTSD or bipolar disorder.  Based on substance use history it is far more likely that substance-induced mood disorder was more accurate but will continue to assess over time to make sure she does not have bipolar 1 disorder.  With this in mind she also was unable to say why she was on the medication regimen that she presented with but regardless it did not appear to be helping her overall levels of depression and anxiety.  She was extremely dysphoric during assessment and frequently tearful with some hopelessness around her  current state.  She had many panic attacks throughout the week which seem to be getting worse with a possible promotion at work.  She had some concerns that her  memory was impaired which could be more acutely reflective of the amount of Imodium and atropine containing antidiarrheals that she was taking well beyond the prescribed norm.  She has an upcoming appointment with GI but encouraged her to reach out to PCP in the meantime as she was also taking Pepto-Bismol on top of this.  Ultimately with PCP to get updated monitoring labs while on long-term lithium, Seroquel, Lamictal and try to check a vitamin D level.  She previously had a gastric bypass surgery but has put on 75 pounds since moving to [place] and appears to be frequently binge eating.  Started Prozac as next agent is as good data for binge eating disorder as well as generalized anxiety with panic and depressive disorder.  We will plan on discontinuing lithium as next agent as at its current dose it is unlikely to be providing much benefit anyway.  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  Have placed referral for psychotherapy.     Plan:   # Borderline personality disorder  historical diagnosis of bipolar 1 disorder rule out substance induced from history of methamphetamine and alcohol use disorder Past medication trials: See medication trials below Status of problem: Chronic and stable Interventions: -- Continue Seroquel 200 mg nightly for now --Continue lithium 300 mg nightly for now --Continue Lamictal 200 mg daily for now --Continue to assess for accuracy of bipolar diagnosis   # Major depressive disorder, recurrent severe without psychotic features Past medication trials:  Status of problem: Chronic and stable Interventions: -- Seroquel, lithium, Lamictal as above --Psychotherapy referral -- Continue Prozac 10 mg once nightly (s7/8/24)   # Generalized anxiety disorder with panic attacks Past medication trials:  Status of problem: Improving Interventions: -- Prozac, Seroquel,  psychotherapy as above --Continue propranolol 10 mg twice daily and we will continue to assess if still needed   # Psychophysiologic insomnia with p.m. caffeine use and history of snoring Past medication trials:  Status of problem: Improving Interventions: -- Patient to cut back on caffeine use --Seroquel, Prozac as above   # History of vitamin D and iron deficiency with history of gastric bypass surgery Past medication trials:  Status of problem: Chronic and stable Interventions: -- Coordinate with PCP for vitamin D level and consider nutrition referral   # High risk medication use: Lithium and Lamictal Past medication trials:  Status of problem: Chronic and stable Interventions: --Lithium level on 07/24/2023 0.4 -- Coordinate with PCP to get updated CMP, TSH with total T3 and free T4   # Long-term current use of antipsychotic: Seroquel Past medication trials:  Status of problem: Chronic and stable Interventions: -- Coordinate with PCP to get updated lipid panel, A1c, EKG   # History of tobacco use disorder in sustained remission Past medication trials:  Status of problem: In remission Interventions: -- Continue to encourage abstinence  Patient was given contact information for behavioral health clinic and was instructed to call 911 for emergencies.   Subjective:  Chief Complaint:  Chief Complaint  Patient presents with   Borderline personality disorder   Panic Attack   Depression   Anxiety   Follow-up   Eating Disorder    Interval History: Doing ok  today. Doesn't feel like she wants to cry at the drop of a hat now. 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 Not as nervous as she was. When first starting the prozac was shaky and nervous so switched to nighttime. Has actually started sleeping a little bit more with the change. Still up and down through the night but getting around 10hrs per  night. Reviewed lithium level of 0.4 but not trying to treat a bipolar illness. Sometimes has sensation of something crawling on her and will scratch but not to the point of bleeding which is a slight improvement. Binge eating still roughly the same and when waking during the night will eat snacks.  Visit Diagnosis:    ICD-10-CM   1. Borderline personality disorder (HCC)  F60.3     2. PTSD (post-traumatic stress disorder)  F43.10     3. Major depressive disorder, recurrent severe without psychotic features (HCC)  F33.2     4. Long-term use of high-risk medication: Lithium  Z79.899     5. Long term current use of antipsychotic medication  Z79.899     6. Insomnia with night caffeine use  G47.09     7. Generalized anxiety disorder with panic attacks  F41.1    F41.0     8. History of bipolar disorder  F31.9     9. 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, history of alcohol use disorder in sustained remission, history of tobacco use disorder in sustained remission Medication trials: effexor (doesn't remember), lithium (hard to say if working), seroquel (hard to say if working), Museum/gallery curator (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 No tobacco products since in roughly 2002. No other drugs at present. Quit methamphetamine in 1999 as well; used IV and was treated for hepatitis C.   Past Medical History:  Past Medical History:  Diagnosis Date   Anxiety    Bipolar 1 disorder (HCC)    Borderline personality disorder (HCC)    Depression    PTSD (post-traumatic stress disorder)     Past Surgical History:  Procedure Laterality Date   GASTRIC BYPASS  10/2019   HERNIA REPAIR  2022   hernia repair and mass removal   WRIST FRACTURE SURGERY Left 12/20/2020    Family Psychiatric History: son on medication for bipolar and depression, mother/father alcoholic   Family History:  Family History  Problem Relation Age of Onset   Brain cancer Brother    Rectal cancer Maternal Uncle        met to bones    Social History:  Academic/Vocational: Works at [employer]   Social History   Socioeconomic History   Marital status: Single    Spouse name: Not on file  Number of children: Not on file   Years of education: Not on file   Highest education level: Not on file  Occupational History   Not on file  Tobacco Use   Smoking status: Former    Current packs/day: 0.00    Types: Cigarettes    Quit date: 2002    Years since quitting: 22.6   Smokeless tobacco: Never  Vaping Use   Vaping status: Never Used  Substance and Sexual Activity   Alcohol use: Not Currently    Comment: Sober since 1999   Drug use: Not Currently    Types: Methamphetamines    Comment: Sober since 1999   Sexual activity: Not Currently  Other Topics Concern   Not on file  Social History Narrative   Not on file   Social Determinants of Health   Financial Resource Strain: Low Risk  (09/20/2021)   Overall Financial Resource Strain (CARDIA)    Difficulty of Paying Living Expenses: Not hard at all  Food Insecurity: No Food Insecurity (09/20/2021)   Hunger Vital Sign    Worried About  Running Out of Food in the Last Year: Never true    Ran Out of Food in the Last Year: Never true  Transportation Needs: No Transportation Needs (09/20/2021)   PRAPARE - Administrator, Civil Service (Medical): No    Lack of Transportation (Non-Medical): No  Physical Activity: Inactive (09/20/2021)   Exercise Vital Sign    Days of Exercise per Week: 0 days    Minutes of Exercise per Session: 0 min  Stress: Stress Concern Present (09/20/2021)   [employer] of Occupational Health - Occupational Stress Questionnaire    Feeling of Stress : To some extent  Social Connections: Moderately Isolated (09/20/2021)   Social Connection and Isolation Panel [NHANES]    Frequency of Communication with Friends and Family: More than three times a week    Frequency of Social Gatherings with Friends and Family: More than three times a week    Attends Religious Services: More than 4 times per year    Active Member of [employer] or Organizations: No    Attends Banker Meetings: Never    Marital Status: Never married    Allergies:  Allergies  Allergen Reactions   Lisinopril Cough    Current Medications: Current Outpatient Medications  Medication Sig Dispense Refill   cholestyramine (QUESTRAN) 4 g packet Take 4 g by mouth 3 (three) times daily.     FLUoxetine (PROZAC) 10 MG capsule Take 1 capsule (10 mg total) by mouth daily. 90 capsule 0   lamoTRIgine (LAMICTAL) 200 MG tablet Take 1 tablet (200 mg total) by mouth daily. 30 tablet 2   lithium carbonate 300 MG capsule Take 1 capsule (300 mg total) by mouth at bedtime. 90 capsule 0   loperamide (IMODIUM A-D) 2 MG tablet Take 2 mg by mouth 3 (three) times daily as needed for diarrhea or loose stools.     Multiple Vitamin (MULTIVITAMIN) tablet Take 1 tablet by mouth daily.     propranolol (INDERAL) 10 MG tablet Take 1 tablet (10 mg total) by mouth 2 (two) times daily as needed (Anxiety/panic). 60 tablet 2   QUEtiapine (SEROQUEL) 200 MG  tablet Take 1 tablet (200 mg total) by mouth at bedtime. 30 tablet 2   No current facility-administered medications for this visit.    ROS: Review of Systems  Constitutional:  Positive for appetite change and unexpected weight change.  HENT:  Dental implants  Gastrointestinal:  Positive for diarrhea. Negative for constipation, nausea and vomiting.  Endocrine: Positive for cold intolerance, heat intolerance and polyphagia.  Musculoskeletal:  Negative for arthralgias and back pain.  Skin:        No hair loss  Neurological:  Positive for headaches. Negative for dizziness.  Psychiatric/Behavioral:  Positive for decreased concentration, dysphoric mood and self-injury. Negative for hallucinations, sleep disturbance and suicidal ideas. The patient is nervous/anxious.     Objective:  Psychiatric Specialty Exam: There were no vitals taken for this visit.There is no height or weight on file to calculate BMI.  General Appearance: Casual, Fairly Groomed, and appears stated age  Eye Contact:  Fair  Speech:  Normal Rate and slight impairment to articulation with dental implants  Volume:  Normal  Mood:   "Okay"  Affect:  Appropriate, Congruent, Depressed, and significantly less anxious with no tearfulness  Thought Content: Logical and Hallucinations: Tactile   Suicidal Thoughts:  No  Homicidal Thoughts:  No  Thought Process:  Coherent, Goal Directed, and Linear  Orientation:  Full (Time, Place, and Person)    Memory:  Grossly intact   Judgment:  Fair  Insight:  Fair  Concentration:  Concentration: Fair  Recall:  not formally assessed   Fund of Knowledge: Fair  Language: Fair  Psychomotor Activity:  Increased and constantly moving  Akathisia:  No  AIMS (if indicated): not done due to limitations of telehealth  Assets:  Communication Skills Desire for Improvement Financial Resources/Insurance Housing Leisure Time Resilience Social  Support Talents/Skills Transportation Vocational/Educational  ADL's:  Intact  Cognition: WNL  Sleep:  Fair   PE: General: sits comfortably in view of camera; no acute distress  Pulm: no increased work of breathing on room air  MSK: all extremity movements appear intact  Neuro: no focal neurological deficits observed  Gait & Station: unable to assess by video    Metabolic Disorder Labs: No results found for: "HGBA1C", "MPG" No results found for: "PROLACTIN" No results found for: "CHOL", "TRIG", "HDL", "CHOLHDL", "VLDL", "LDLCALC" No results found for: "TSH"  Therapeutic Level Labs: No results found for: "LITHIUM" No results found for: "VALPROATE" No results found for: "CBMZ"  Screenings:  PHQ2-9    Flowsheet Row Office Visit from 07/01/2023 in [facility] [place] Office Visit from 09/20/2021 in [facility] [place]  PHQ-2 Total Score 6 2  PHQ-9 Total Score 18 6      Flowsheet Row Office Visit from 07/01/2023 in [facility] [place] [facility] from 05/11/2023 in [facility] at [facility] ED from 08/27/2022 in [facility] Department at [facility]  C-SSRS RISK CATEGORY No Risk No Risk No Risk       Collaboration of Care: Collaboration of Care: Medication Management AEB as above and Primary Care Provider AEB as above  Patient/Guardian was advised Release of Information must be obtained prior to any record release in order to collaborate their care with an outside provider. Patient/Guardian was advised if they have not already done so to contact the registration department to sign all necessary forms in order for [place] to release information regarding their care.   Consent: Patient/Guardian gives verbal consent for treatment and assignment of benefits for services provided during this visit. Patient/Guardian expressed understanding and agreed to  proceed.   Televisit via video: I connected with patient on 08/06/23 at  3:00 PM EDT by a video  enabled telemedicine application and verified that I am speaking with the correct person using two identifiers.  Location: Patient: Home in [place] Provider: remote office in [place]   I discussed the limitations of evaluation and management by telemedicine and the availability of in person appointments. The patient expressed understanding and agreed to proceed.  I discussed the assessment and treatment plan with the patient. The patient was provided an opportunity to ask questions and all were answered. The patient agreed with the plan and demonstrated an understanding of the instructions.   The patient was advised to call back or seek an in-person evaluation if the symptoms worsen or if the condition fails to improve as anticipated.  I provided 20 minutes dedicated to the care of this patient via video on the date of this encounter to include chart review, face-to-face time with the patient, medication management/counseling, coordination of care with PCP.  Elsie Lincoln, MD 08/06/2023, 3:56 PM

## 2023-08-06 NOTE — Patient Instructions (Addendum)
We did not make any medication changes today.  When you get a chance please coordinate with your PCP to get the following: lipid panel, A1c, EKG, CMP, TSH with total T3 and free T4, vitamin D level and consider nutrition referral.

## 2023-08-23 ENCOUNTER — Other Ambulatory Visit: Payer: Self-pay

## 2023-08-23 ENCOUNTER — Emergency Department (HOSPITAL_COMMUNITY)
Admission: EM | Admit: 2023-08-23 | Discharge: 2023-08-23 | Disposition: A | Discharge status: Home / Self Care | Payer: Medicaid Other

## 2023-08-23 ENCOUNTER — Encounter (HOSPITAL_COMMUNITY): Payer: Self-pay

## 2023-08-23 ENCOUNTER — Emergency Department (HOSPITAL_COMMUNITY): Payer: Medicaid Other

## 2023-08-23 DIAGNOSIS — R109 Unspecified abdominal pain: Secondary | ICD-10-CM

## 2023-08-23 DIAGNOSIS — R1084 Generalized abdominal pain: Secondary | ICD-10-CM | POA: Insufficient documentation

## 2023-08-23 LAB — COMPREHENSIVE METABOLIC PANEL
ALT: 19 U/L (ref 0–44)
AST: 25 U/L (ref 15–41)
Albumin: 3.8 g/dL (ref 3.5–5.0)
Alkaline Phosphatase: 90 U/L (ref 38–126)
Anion gap: 8 (ref 5–15)
BUN: 15 mg/dL (ref 8–23)
CO2: 21 mmol/L — ABNORMAL LOW (ref 22–32)
Calcium: 8.8 mg/dL — ABNORMAL LOW (ref 8.9–10.3)
Chloride: 106 mmol/L (ref 98–111)
Creatinine, Ser: 0.71 mg/dL (ref 0.44–1.00)
GFR, Estimated: >60 mL/min (ref >60)
Glucose, Bld: 89 mg/dL (ref 70–99)
Potassium: 3.6 mmol/L (ref 3.5–5.1)
Sodium: 135 mmol/L (ref 135–145)
Total Bilirubin: 0.3 mg/dL (ref 0.3–1.2)
Total Protein: 6.5 g/dL (ref 6.5–8.1)

## 2023-08-23 LAB — URINALYSIS, ROUTINE W REFLEX MICROSCOPIC
Bacteria, UA: NONE SEEN
Bilirubin Urine: NEGATIVE
Glucose, UA: NEGATIVE mg/dL
Hgb urine dipstick: NEGATIVE
Ketones, ur: NEGATIVE mg/dL
Nitrite: NEGATIVE
Protein, ur: NEGATIVE mg/dL
Specific Gravity, Urine: 1.019 (ref 1.005–1.030)
pH: 5 (ref 5.0–8.0)

## 2023-08-23 LAB — CBC
HCT: 35.4 % — ABNORMAL LOW (ref 36.0–46.0)
Hemoglobin: 11.3 g/dL — ABNORMAL LOW (ref 12.0–15.0)
MCH: 28.5 pg (ref 26.0–34.0)
MCHC: 31.9 g/dL (ref 30.0–36.0)
MCV: 89.2 fL (ref 80.0–100.0)
Platelets: 194 10*3/uL (ref 150–400)
RBC: 3.97 MIL/uL (ref 3.87–5.11)
RDW: 15 % (ref 11.5–15.5)
WBC: 6 10*3/uL (ref 4.0–10.5)
nRBC: 0 % (ref 0.0–0.2)

## 2023-08-23 LAB — LIPASE, BLOOD: Lipase: 26 U/L (ref 11–51)

## 2023-08-23 MED ORDER — CEPHALEXIN 500 MG PO CAPS: 500.0000 mg | ORAL_CAPSULE | Freq: Four times a day (QID) | ORAL | 0 refills | Status: AC

## 2023-08-23 MED ORDER — IOHEXOL 300 MG/ML  SOLN
100.0000 mL | Freq: Once | INTRAMUSCULAR | Status: AC | PRN
  Administered 2023-08-23: 100 mL via INTRAVENOUS

## 2023-08-23 MED ORDER — OXYCODONE-ACETAMINOPHEN 5-325 MG PO TABS
1.0000 | ORAL_TABLET | Freq: Once | ORAL | Status: AC
  Administered 2023-08-23: 1 via ORAL
  Filled 2023-08-23: qty 1

## 2023-08-23 NOTE — ED Provider Notes (Signed)
[place] EMERGENCY DEPARTMENT AT [facility] Provider Note   CSN: [id number] Arrival date & time: 08/23/23  1119     History  Chief Complaint  Patient presents with   Abdominal Pain    Barbara Meyer is a 62 y.o. female.  Presenting emergency department for abdominal pain.  Left-sided.  Gradual onset over the past 5 days.  No nausea no vomiting no diarrhea.  Last bowel movement was this morning normal.  Denies any fevers chills chest pain shortness of breath.  Significant surgical history gastric bypass, history of cancer, close ectomy, prior hernia.   Abdominal Pain      Home Medications Prior to Admission medications   Medication Sig Start Date End Date Taking? Authorizing Provider  cephALEXin (KEFLEX) 500 MG capsule Take 1 capsule (500 mg total) by mouth 4 (four) times daily for 7 days. 08/23/23 08/30/23 Yes Larosa Rhines, Harmon Dun, DO  cholestyramine (QUESTRAN) 4 g packet Take 4 g by mouth 3 (three) times daily. 07/24/23 10/22/23  [provider]  FLUoxetine (PROZAC) 10 MG capsule Take 1 capsule (10 mg total) by mouth daily. 07/26/23   Elsie Lincoln, MD  lamoTRIgine (LAMICTAL) 200 MG tablet Take 1 tablet (200 mg total) by mouth daily. 07/01/23   Elsie Lincoln, MD  lithium carbonate 300 MG capsule Take 1 capsule (300 mg total) by mouth at bedtime. 07/26/23   Elsie Lincoln, MD  loperamide (IMODIUM A-D) 2 MG tablet Take 2 mg by mouth 3 (three) times daily as needed for diarrhea or loose stools.    [provider]  Multiple Vitamin (MULTIVITAMIN) tablet Take 1 tablet by mouth daily.    [provider]  propranolol (INDERAL) 10 MG tablet Take 1 tablet (10 mg total) by mouth 2 (two) times daily as needed (Anxiety/panic). 07/01/23   Elsie Lincoln, MD  QUEtiapine (SEROQUEL) 200 MG tablet Take 1 tablet (200 mg total) by mouth at bedtime. 07/01/23 09/29/23  Elsie Lincoln, MD      Allergies    Lisinopril    Review of Systems   Review of Systems   Gastrointestinal:  Positive for abdominal pain.    Physical Exam Updated Vital Signs BP (!) 157/80   Pulse 67   Temp 98.4 F (36.9 C)   Resp 15   Ht 5\' 6"  (1.676 m)   Wt 86.2 kg   SpO2 100%   BMI 30.67 kg/m  Physical Exam Vitals and nursing note reviewed.  HENT:     Head: Normocephalic.  Cardiovascular:     Rate and Rhythm: Normal rate and regular rhythm.  Pulmonary:     Effort: Pulmonary effort is normal.     Breath sounds: Normal breath sounds.  Abdominal:     General: Abdomen is flat.     Tenderness: There is generalized abdominal tenderness. There is no guarding or rebound.     Hernia: No hernia is present.  Skin:    General: Skin is warm and dry.     Capillary Refill: Capillary refill takes less than 2 seconds.  Neurological:     Mental Status: She is alert and oriented to person, place, and time.  Psychiatric:        Mood and Affect: Mood normal.        Behavior: Behavior normal.     ED Results / Procedures / Treatments   Labs (all labs ordered are listed, but only abnormal results are displayed) Labs Reviewed  COMPREHENSIVE METABOLIC PANEL - Abnormal;  Notable for the following components:      Result Value   CO2 21 (*)    Calcium 8.8 (*)    All other components within normal limits  CBC - Abnormal; Notable for the following components:   Hemoglobin 11.3 (*)    HCT 35.4 (*)    All other components within normal limits  URINALYSIS, ROUTINE W REFLEX MICROSCOPIC - Abnormal; Notable for the following components:   APPearance HAZY (*)    Leukocytes,Ua TRACE (*)    All other components within normal limits  LIPASE, BLOOD    EKG None  Radiology CT ABDOMEN PELVIS W CONTRAST  Result Date: 08/23/2023 CLINICAL DATA:  LEFT lower quadrant pain. Concern for diverticulitis. Complications suspected. Stomach pain. Pain radiating to back. EXAM: CT ABDOMEN AND PELVIS WITH CONTRAST TECHNIQUE: Multidetector CT imaging of the abdomen and pelvis was performed using  the standard protocol following bolus administration of intravenous contrast. RADIATION DOSE REDUCTION: This exam was performed according to the departmental dose-optimization program which includes automated exposure control, adjustment of the mA and/or kV according to patient size and/or use of iterative reconstruction technique. CONTRAST:  OMNIPAQUE IOHEXOL 300 MG/ML  SOLN COMPARISON:  None Available. FINDINGS: Lower chest: Lung bases are clear. Hepatobiliary: No focal hepatic lesion. Postcholecystectomy. No biliary duct dilatation. Common bile duct is prominent following cholecystectomy measuring 8 mm in diameter. No change from CT 04/18/2022 Pancreas: Pancreas is normal. No ductal dilatation. No pancreatic inflammation. Spleen: Normal spleen Adrenals/urinary tract: Adrenal glands and kidneys are normal. The ureters and bladder normal. Stomach/Bowel: Post bariatric surgery. Roux-en-Y anatomy. No evidence of bowel obstruction no inflammation. Terminal ileum and appendix normal. The colon and rectosigmoid colon are normal. No evidence of bowel inflammation. No diverticula. Vascular/Lymphatic: Abdominal aorta is normal caliber. No periportal or retroperitoneal adenopathy. No pelvic adenopathy. Reproductive: Post hysterectomy.  Adnexa unremarkable Other: Several small high-density rounded object in the distal small bowel are favored medications. Musculoskeletal: No aggressive osseous lesion. IMPRESSION: 1. No acute findings in the abdomen pelvis. No explanation for LEFT lower quadrant pain. 2. Post bariatric surgery with Roux-en-Y anatomy. No complication. 3. Post cholecystectomy with stable prominence of the common bile duct. Electronically Signed   By: Genevive Bi M.D.   On: 08/23/2023 20:46    Procedures Procedures    Medications Ordered in ED Medications  oxyCODONE-acetaminophen (PERCOCET/ROXICET) 5-325 MG per tablet 1 tablet (1 tablet Oral Given 08/23/23 1938)  iohexol (OMNIPAQUE) 300 MG/ML  solution 100 mL (100 mLs Intravenous Contrast Given 08/23/23 2026)    ED Course/ Medical Decision Making/ A&P Clinical Course as of 08/23/23 2329  Fri Aug 23, 2023  2102 CT ABDOMEN PELVIS W CONTRAST MPRESSION: 1. No acute findings in the abdomen pelvis. No explanation for LEFT lower quadrant pain. 2. Post bariatric surgery with Roux-en-Y anatomy. No complication. 3. Post cholecystectomy with stable prominence of the common bile duct.   [TY]    Clinical Course User Index [TY] Coral Spikes, DO                                 Medical Decision Making Well-appearing 62 year old female presenting emergency department for gradual onset abdominal pain.  She is afebrile vital signs reassuring.  Has some generalized abdominal pain on exam, but rebound or guarding.  Soft abdomen.  Labs today with no significant metabolic derangements.  No AST/ALT elevation suggest PAD biliary disease.  Lipase normal.  Pancreatitis unlikely.  No fever tachycardia leukocytosis to suggest systemic infection.  Does have some minor evidence of UTI.  Given patient's significant surgical history obtained CT abdomen which showed no acute pathology.  Unclear etiology of patient's symptoms.  Was given a Percocet for pain with some improvement.  Discussed follow-up with her PCP.  Patient stable for discharge at this time.  Will give antibiotics for UTI.  Amount and/or Complexity of Data Reviewed Labs: ordered. Radiology: ordered. Decision-making details documented in ED Course.  Risk Prescription drug management.          Final Clinical Impression(s) / ED Diagnoses Final diagnoses:  Abdominal pain, unspecified abdominal location    Rx / DC Orders ED Discharge Orders          Ordered    cephALEXin (KEFLEX) 500 MG capsule  4 times daily        08/23/23 2206              Coral Spikes, DO 08/23/23 2329

## 2023-08-23 NOTE — Discharge Instructions (Signed)
Please follow-up with your primary doctor.  Return immediately felt fevers, chills, worsening abdominal pain, inability eat or drink due to nausea vomiting, chest pain, shortness of breath or any new or worsening symptoms that are concerning to you.

## 2023-08-23 NOTE — ED Triage Notes (Signed)
LLQ stomachache x1 week  Last night started to radiate to back   Last BM this morning Urinating normal  Pain 7/10 Denies N/V/D

## 2023-09-06 ENCOUNTER — Telehealth (INDEPENDENT_AMBULATORY_CARE_PROVIDER_SITE_OTHER): Payer: Medicaid Other | Admitting: Psychiatry

## 2023-09-06 ENCOUNTER — Encounter (HOSPITAL_COMMUNITY): Payer: Self-pay | Admitting: Psychiatry

## 2023-09-06 DIAGNOSIS — F411 Generalized anxiety disorder: Secondary | ICD-10-CM

## 2023-09-06 DIAGNOSIS — F4310 Post-traumatic stress disorder, unspecified: Secondary | ICD-10-CM

## 2023-09-06 DIAGNOSIS — F332 Major depressive disorder, recurrent severe without psychotic features: Secondary | ICD-10-CM | POA: Diagnosis not present

## 2023-09-06 DIAGNOSIS — F410 Panic disorder [episodic paroxysmal anxiety] without agoraphobia: Secondary | ICD-10-CM | POA: Diagnosis not present

## 2023-09-06 DIAGNOSIS — F15982 Other stimulant use, unspecified with stimulant-induced sleep disorder: Secondary | ICD-10-CM

## 2023-09-06 DIAGNOSIS — Z79899 Other long term (current) drug therapy: Secondary | ICD-10-CM

## 2023-09-06 DIAGNOSIS — F603 Borderline personality disorder: Secondary | ICD-10-CM

## 2023-09-06 DIAGNOSIS — G4709 Other insomnia: Secondary | ICD-10-CM

## 2023-09-06 DIAGNOSIS — R45851 Suicidal ideations: Secondary | ICD-10-CM | POA: Insufficient documentation

## 2023-09-06 DIAGNOSIS — Z5181 Encounter for therapeutic drug level monitoring: Secondary | ICD-10-CM

## 2023-09-06 MED ORDER — FLUOXETINE HCL 20 MG PO CAPS
20.0000 mg | ORAL_CAPSULE | Freq: Every day | ORAL | 2 refills | Status: DC
Start: 2023-09-06 — End: 2023-10-03

## 2023-09-06 NOTE — Progress Notes (Signed)
BH MD Outpatient Progress Note  09/06/2023 8:34 AM Barbara Meyer  MRN:  [id number]  Assessment:  Barbara Meyer presents for follow-up evaluation. Today, 09/06/23, patient reports significant worsening of depression with return of suicidal ideation in the setting of worsening abdominal pain that radiates to her back.  This required a Emergency Room visit on 08/23/2023 but no conclusive answers were found for why this might be occurring.  Follow-up with PCP she unfortunately did not get the thyroid studies that we were looking for and recommendation to cut back on her antidiarrheal led to worsening diarrhea.  She will follow up with GI next week.  For her suicidal ideation, she specifically denies intent or plan but is occurring for several hours every day and is conceptualized as an escape from pain.  Had direct discussion of if this worsens in any way to let our office know or to contact 911 versus [phone] and possible partial hospitalization but she did not feel like she could take time off of work due to a constantly shifting work schedule.  She is looking forward to an upcoming wedding of her son which will have some time off of work so she is still forward thinking and has never had a suicide attempt in the past despite many years of borderline personality disorder.  She was amenable to titration of Prozac which had been effective for both anxiety and depression at the 10 mg dose previously before the pain onset.  Still taking at night with sleeping about 10 hours a night.  She also needs lipid panel, A1c, EKG while on Seroquel.  Binge eating still roughly the same but self-harm slightly improved with not breaking the skin while scratching; does report tactile sensation of something crawling on her which will need to continue to be monitored.  Until suicidal ideation resolves we will need to keep the number of pills on hand at a low amount.  Follow-up in 2 weeks.   For safety, her acute risk factors for  suicide are: Diagnosis of borderline personality disorder, current diagnosis of depression, self-harm by scratching, suicidal ideation without intent or plan.  Her chronic risk factors for suicide are: History of substance use, history of alcohol use disorder, historical diagnosis of bipolar disorder, diagnosed borderline personality disorder, social isolation.  Her protective factors are: Employment, supportive family, beloved pets in the home, actively seeking and engaging with mental health care no suicidal ideation, no access to firearms, hope for the future.  While future events cannot be fully predicted she does not currently meet IVC criteria and can be continued as an outpatient.  Identifying Information: Barbara Meyer is a 62 y.o. 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 Initial evaluation of depression and anxiety on 07/01/23; please see that note for full case formulation.  Patient reported past heavy use of both alcohol and methamphetamines during the same timeframe in which she received a bipolar 1 diagnosis many years ago.  Unfortunately do not have records from that time when she was living in [place]  having only briefly moved to [place] in roughly 2022.  She carried a historical diagnosis of bipolar 1 disorder as well as borderline personality disorder and PTSD but at time of initial appointment did not  meet symptom burden of PTSD or bipolar disorder.  Based on substance use history it is far more likely that substance-induced mood disorder was more accurate but will continue to assess over time to make sure she does not have bipolar 1 disorder.  With this in mind she also was unable to say why she was on the medication regimen that she presented with but regardless it did not appear to be helping her overall levels of depression and anxiety.  She was extremely dysphoric during assessment and frequently tearful with some hopelessness around her current state.  She had many panic attacks throughout the week which seem to be getting worse with a possible promotion at work.  She had some concerns that her memory was impaired which could be more acutely reflective of the amount of Imodium and atropine containing antidiarrheals that she was taking well beyond the prescribed norm.  She has an upcoming appointment with GI but encouraged her to reach out to PCP in the meantime as she was also taking Pepto-Bismol on top of this.  Ultimately with PCP to get updated monitoring labs while on long-term lithium, Seroquel, Lamictal and try to check a vitamin D level.  She previously had a gastric bypass surgery but has put on 75 pounds since moving to [place] and appears to be frequently binge eating.  Started Prozac as next agent is as good data for binge eating disorder as well as generalized anxiety with panic and depressive disorder.  We will plan on discontinuing lithium as next agent as at its current dose it is unlikely to be providing much benefit anyway.  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  Have placed referral for psychotherapy.   Was able to get a lithium level that was drawn by gastroenterologist and found to be 0.4 but as previously discussed much lower likelihood that she has a bipolar spectrum of illness  and much more likely this is a primary borderline personality disorder which would not require higher doses of lithium.   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 caffeine use and history of snoring Past medication trials:  Status of problem: Improving Interventions: -- Patient to cut back on caffeine use --Seroquel, Prozac as above   # History  of vitamin D and iron deficiency with history of gastric bypass surgery Past medication trials:  Status of problem: Chronic and stable Interventions: -- Coordinate with PCP for vitamin D level and consider nutrition referral   # High risk medication use: Lithium and Lamictal Past medication trials:  Status of problem: Chronic and stable Interventions: --Lithium level on 07/24/2023 0.4, CMP within normal limits on 08/23/23 -- Coordinate with PCP to get updated TSH with total T3 and  free T4   # Long-term current use of antipsychotic: Seroquel Past medication trials:  Status of problem: Chronic and stable Interventions: -- Coordinate with PCP to get updated lipid panel, A1c, EKG   # History of tobacco use disorder in sustained remission Past medication trials:  Status of problem: In remission Interventions: -- Continue to encourage abstinence  Patient was given contact information for behavioral health clinic and was instructed to call 911 for emergencies.   Subjective:  Chief Complaint:  Chief Complaint  Patient presents with   Borderline personality disorder   Suicidal ideation   Follow-up   Depression   Anxiety    Interval History: Not doing well, more depression and very sad. Had stomach pain that is going from there to back and went to the ER. Will see GI on Wednesday as cutting back on antidiarrheal led to more diarrhea. With all this, suicidal thoughts are happening everyday and stays for a couple hours at a time. Denies having a plan but thinks her life sucks and wants to stop pretending that things are ok. Doesn't think she needs to come into the hospital. Reviewed partial hospitalization, worries that she wouldn't know what her schedule was without much notice. Son's wedding is coming up and will be taking some time off for that. Denies intent when specifically asked and is looking forward to her son's wedding and wants to be able to enjoy life again.  Would be amenable to titration of prozac. Still up and down through the night but getting around 10hrs per night with assistance of Seroquel.  Still with scratching but not to the point of bleeding which is a slight improvement. Binge eating still roughly the same and when waking during the night will eat snacks.  Visit Diagnosis:    ICD-10-CM   1. Borderline personality disorder (HCC)  F60.3     2. Generalized anxiety disorder with panic attacks  F41.1 FLUoxetine (PROZAC) 20 MG capsule   F41.0     3.  Major depressive disorder, recurrent severe without psychotic features (HCC)  F33.2 FLUoxetine (PROZAC) 20 MG capsule    4. Insomnia with night caffeine use  G47.09     5. Long term current use of antipsychotic medication  Z79.899     6. Long-term use of high-risk medication: Lithium  Z79.899     7. PTSD (post-traumatic stress disorder)  F43.10     8. 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, history of alcohol use disorder in sustained remission, history of tobacco use disorder in sustained remission Medication trials: effexor (doesn't remember), lithium (hard to say if working), seroquel (hard to say if working), latuda (hard to say if worked), trazodone (doesn't remember), hydroxyzine (doesn't remember),  lamictal (doesn't remember), propranolol, wellbutrin (felt like being on meth again and was more isolative), Prozac (effective) Previous psychiatrist/therapist: yes to both Hospitalizations: none Suicide attempts: none SIB: scratches at times on arms Hx of violence towards others: none Current access to guns: none Hx of trauma/abuse: none Substance use: No alcohol at present, has been 25 years since last drink as a recovering alcoholic since 1999. No tobacco products since in roughly 2002. No other drugs at present. Quit methamphetamine in 1999 as well; used IV and was treated for hepatitis C.   Past Medical History:  Past Medical History:  Diagnosis Date   Anxiety    Bipolar 1 disorder (HCC)    Borderline personality disorder (HCC)    Depression    PTSD (post-traumatic stress disorder)     Past Surgical  History:  Procedure Laterality Date   GASTRIC BYPASS  10/2019   HERNIA REPAIR  2022   hernia repair and mass removal   WRIST FRACTURE SURGERY Left 12/20/2020    Family Psychiatric History: son on medication for bipolar and depression, mother/father alcoholic   Family History:  Family History  Problem Relation Age of Onset   Brain cancer Brother    Rectal cancer Maternal Uncle        met to bones    Social History:  Academic/Vocational: Works at [employer]   Social History   Socioeconomic History   Marital status: Single    Spouse name: Not on file   Number of children: Not on file   Years of education: Not on file   Highest education level: Not on file  Occupational History   Not on file  Tobacco Use   Smoking status: Former    Current packs/day: 0.00    Types: Cigarettes    Quit date: 2002    Years since quitting: 22.7   Smokeless tobacco: Never  Vaping Use   Vaping status: Never Used  Substance and Sexual Activity   Alcohol use: Not Currently    Comment: Sober since 1999   Drug use: Not Currently    Types: Methamphetamines    Comment: Sober since 1999   Sexual activity: Not Currently  Other Topics Concern   Not on file  Social History Narrative   Not on file   Social Determinants of Health   Financial Resource Strain: Low Risk  (09/20/2021)   Overall Financial Resource Strain (CARDIA)    Difficulty of Paying Living Expenses: Not hard at all  Food Insecurity: No Food Insecurity (09/20/2021)   Hunger Vital Sign    Worried About Running Out of Food in the Last Year: Never true    Ran Out of Food in the Last Year: Never true  Transportation Needs: No Transportation Needs (09/20/2021)   PRAPARE - Administrator, Civil Service (Medical): No    Lack of Transportation (Non-Medical): No  Physical Activity: Inactive (09/20/2021)   Exercise Vital Sign    Days of Exercise per Week: 0 days    Minutes of Exercise per Session: 0 min  Stress: Stress  Concern Present (09/20/2021)   [employer] of Occupational Health - Occupational Stress Questionnaire    Feeling of Stress : To some extent  Social Connections: Moderately Isolated (09/20/2021)   Social Connection and Isolation Panel [NHANES]    Frequency of Communication with Friends and Family: More than three times a week    Frequency of Social Gatherings with Friends and Family: More than three times a week  Attends Religious Services: More than 4 times per year    Active Member of Clubs or Organizations: No    Attends Banker Meetings: Never    Marital Status: Never married    Allergies:  Allergies  Allergen Reactions   Lisinopril Cough    Current Medications: Current Outpatient Medications  Medication Sig Dispense Refill   cholestyramine (QUESTRAN) 4 g packet Take 4 g by mouth 3 (three) times daily.     FLUoxetine (PROZAC) 20 MG capsule Take 1 capsule (20 mg total) by mouth at bedtime. 30 capsule 2   lamoTRIgine (LAMICTAL) 200 MG tablet Take 1 tablet (200 mg total) by mouth daily. 30 tablet 2   lithium carbonate 300 MG capsule Take 1 capsule (300 mg total) by mouth at bedtime. 90 capsule 0   loperamide (IMODIUM A-D) 2 MG tablet Take 2 mg by mouth 3 (three) times daily as needed for diarrhea or loose stools.     Multiple Vitamin (MULTIVITAMIN) tablet Take 1 tablet by mouth daily.     propranolol (INDERAL) 10 MG tablet Take 1 tablet (10 mg total) by mouth 2 (two) times daily as needed (Anxiety/panic). 60 tablet 2   QUEtiapine (SEROQUEL) 200 MG tablet Take 1 tablet (200 mg total) by mouth at bedtime. 30 tablet 2   No current facility-administered medications for this visit.    ROS: Review of Systems  Constitutional:  Positive for appetite change and unexpected weight change.  HENT:         Dental implants  Gastrointestinal:  Positive for abdominal pain and diarrhea. Negative for constipation, nausea and vomiting.  Endocrine: Positive for cold intolerance,  heat intolerance and polyphagia.  Musculoskeletal:  Positive for back pain. Negative for arthralgias.  Skin:        No hair loss  Neurological:  Positive for headaches. Negative for dizziness.  Psychiatric/Behavioral:  Positive for decreased concentration, dysphoric mood, self-injury and suicidal ideas. Negative for hallucinations and sleep disturbance. The patient is nervous/anxious.     Objective:  Psychiatric Specialty Exam: There were no vitals taken for this visit.There is no height or weight on file to calculate BMI.  General Appearance: Casual, Fairly Groomed, and appears stated age  Eye Contact:  Fair  Speech:  Normal Rate and slight impairment to articulation with dental implants  Volume:  Normal  Mood:   "I just want to stop pretending things are okay"  Affect:  Appropriate, Congruent, Depressed, and more anxious and dysphoric with tearfulness  Thought Content: Logical and Hallucinations: Tactile   Suicidal Thoughts:  Yes.  without intent/plan  Homicidal Thoughts:  No  Thought Process:  Coherent, Goal Directed, and Linear  Orientation:  Full (Time, Place, and Person)    Memory:  Grossly intact   Judgment:  Fair  Insight:  Fair  Concentration:  Concentration: Fair  Recall:  not formally assessed   Fund of Knowledge: Fair  Language: Fair  Psychomotor Activity:  Increased and constantly moving  Akathisia:  No  AIMS (if indicated): not done due to limitations of telehealth  Assets:  Communication Skills Desire for Improvement Financial Resources/Insurance Housing Leisure Time Resilience Social Support Talents/Skills Transportation Vocational/Educational  ADL's:  Intact  Cognition: WNL  Sleep:  Fair   PE: General: sits comfortably in view of camera; actively crying at times Pulm: no increased work of breathing on room air  MSK: all extremity movements appear intact  Neuro: no focal neurological deficits observed  Gait & Station: unable to assess by video  Metabolic Disorder Labs: No results found for: "HGBA1C", "MPG" No results found for: "PROLACTIN" No results found for: "CHOL", "TRIG", "HDL", "CHOLHDL", "VLDL", "LDLCALC" No results found for: "TSH"  Therapeutic Level Labs: No results found for: "LITHIUM" No results found for: "VALPROATE" No results found for: "CBMZ"  Screenings:  PHQ2-9    Flowsheet Row Office Visit from 07/01/2023 in [facility] [place] Office Visit from 09/20/2021 in [facility]  PHQ-2 Total Score 6 2  PHQ-9 Total Score 18 6      Flowsheet Row ED from 08/23/2023 in [facility] at [facility] Office Visit from 07/01/2023 in [facility] [place] ED from 05/11/2023 in [facility] at [facility]  C-SSRS RISK CATEGORY No Risk No Risk No Risk       Collaboration of Care: Collaboration of Care: Medication Management AEB as above and Primary Care Provider AEB as above  Patient/Guardian was advised Release of Information must be obtained prior to any record release in order to collaborate their care with an outside provider. Patient/Guardian was advised if they have not already done so to contact the registration department to sign all necessary forms in order for [place] to release information regarding their care.   Consent: Patient/Guardian gives verbal consent for treatment and assignment of benefits for services provided during this visit. Patient/Guardian expressed understanding and agreed to proceed.   Televisit via video: I connected with patient on 09/06/23 at  8:00 AM EDT by a video enabled telemedicine application and verified that I am speaking with the correct person using two identifiers.  Location: Patient: At work at the [employer]: remote office in [place]   I discussed the limitations of evaluation and management by telemedicine and the  availability of in person appointments. The patient expressed understanding and agreed to proceed.  I discussed the assessment and treatment plan with the patient. The patient was provided an opportunity to ask questions and all were answered. The patient agreed with the plan and demonstrated an understanding of the instructions.   The patient was advised to call back or seek an in-person evaluation if the symptoms worsen or if the condition fails to improve as anticipated.  I provided 20 minutes dedicated to the care of this patient via video on the date of this encounter to include chart review, face-to-face time with the patient, medication management/counseling, coordination of care with PCP, discussion of higher level of care.  Elsie Lincoln, MD 09/06/2023, 8:34 AM

## 2023-09-06 NOTE — Patient Instructions (Addendum)
We increased the Prozac (fluoxetine) to 20 mg nightly.  We also discussed possibility of partial hospitalization which could be done from with you in your home but he would likely need to take time off of work to do as it is multiple hours per day only in the mornings.  If the suicidal ideation worsens in any way please call 911 or 988 which is a mental health specific hotline and both are available 24 hours a day 7 days a week.  You can also let our office know and we will plan on seeing each other again on 09/19/23 at 4 PM.  When he get a chance please coordinate with your PCP to get an updated lipid panel, A1c, EKG, TSH, total T3, free T4 while you are on the Seroquel and lithium respectively.

## 2023-09-19 ENCOUNTER — Telehealth (HOSPITAL_COMMUNITY): Payer: Medicaid Other | Admitting: Psychiatry

## 2023-09-21 ENCOUNTER — Other Ambulatory Visit: Payer: Self-pay

## 2023-09-21 ENCOUNTER — Encounter (HOSPITAL_COMMUNITY): Payer: Self-pay | Admitting: Emergency Medicine

## 2023-09-21 ENCOUNTER — Emergency Department (HOSPITAL_COMMUNITY)
Admission: EM | Admit: 2023-09-21 | Discharge: 2023-09-22 | Disposition: A | Discharge status: Home / Self Care | Payer: Medicaid Other | Attending: Emergency Medicine | Admitting: Emergency Medicine

## 2023-09-21 DIAGNOSIS — Z85048 Personal history of other malignant neoplasm of rectum, rectosigmoid junction, and anus: Secondary | ICD-10-CM | POA: Diagnosis not present

## 2023-09-21 DIAGNOSIS — R1084 Generalized abdominal pain: Secondary | ICD-10-CM | POA: Diagnosis not present

## 2023-09-21 DIAGNOSIS — R109 Unspecified abdominal pain: Secondary | ICD-10-CM | POA: Diagnosis present

## 2023-09-21 DIAGNOSIS — R197 Diarrhea, unspecified: Secondary | ICD-10-CM | POA: Diagnosis not present

## 2023-09-21 LAB — CBC
HCT: 35.4 % — ABNORMAL LOW (ref 36.0–46.0)
Hemoglobin: 11 g/dL — ABNORMAL LOW (ref 12.0–15.0)
MCH: 27.9 pg (ref 26.0–34.0)
MCHC: 31.1 g/dL (ref 30.0–36.0)
MCV: 89.8 fL (ref 80.0–100.0)
Platelets: 209 10*3/uL (ref 150–400)
RBC: 3.94 MIL/uL (ref 3.87–5.11)
RDW: 14.5 % (ref 11.5–15.5)
WBC: 5.5 10*3/uL (ref 4.0–10.5)
nRBC: 0 % (ref 0.0–0.2)

## 2023-09-21 NOTE — ED Triage Notes (Signed)
C/o lower abdominal pain that radiates to back. States she was seen here 2 weeks ago for same and was referred to GI as follow-up. States she saw GI and was given Zofran po for home use. Pt states she is still having pain.

## 2023-09-22 LAB — URINALYSIS, ROUTINE W REFLEX MICROSCOPIC
Bacteria, UA: NONE SEEN
Bilirubin Urine: NEGATIVE
Glucose, UA: NEGATIVE mg/dL
Ketones, ur: NEGATIVE mg/dL
Nitrite: NEGATIVE
Protein, ur: NEGATIVE mg/dL
Specific Gravity, Urine: 1.019 (ref 1.005–1.030)
WBC, UA: >50 WBC/hpf (ref 0–5)
pH: 5 (ref 5.0–8.0)

## 2023-09-22 LAB — COMPREHENSIVE METABOLIC PANEL
ALT: 13 U/L (ref 0–44)
AST: 18 U/L (ref 15–41)
Albumin: 3.8 g/dL (ref 3.5–5.0)
Alkaline Phosphatase: 83 U/L (ref 38–126)
Anion gap: 6 (ref 5–15)
BUN: 11 mg/dL (ref 8–23)
CO2: 22 mmol/L (ref 22–32)
Calcium: 8.6 mg/dL — ABNORMAL LOW (ref 8.9–10.3)
Chloride: 110 mmol/L (ref 98–111)
Creatinine, Ser: 0.7 mg/dL (ref 0.44–1.00)
GFR, Estimated: >60 mL/min (ref >60)
Glucose, Bld: 92 mg/dL (ref 70–99)
Potassium: 3.5 mmol/L (ref 3.5–5.1)
Sodium: 138 mmol/L (ref 135–145)
Total Bilirubin: 0.4 mg/dL (ref 0.3–1.2)
Total Protein: 6.7 g/dL (ref 6.5–8.1)

## 2023-09-22 LAB — LIPASE, BLOOD: Lipase: 24 U/L (ref 11–51)

## 2023-09-22 MED ORDER — HYDROCODONE-ACETAMINOPHEN 5-325 MG PO TABS
1.0000 | ORAL_TABLET | Freq: Once | ORAL | Status: AC
  Administered 2023-09-22: 1 via ORAL
  Filled 2023-09-22: qty 1

## 2023-09-22 NOTE — ED Provider Notes (Signed)
[place] EMERGENCY DEPARTMENT AT [facility] Provider Note   CSN: [id number] Arrival date & time: 09/21/23  2247     History  Chief Complaint  Patient presents with   Abdominal Pain    Barbara Meyer is a 62 y.o. female.  The history is provided by the patient.  Patient with history of bipolar type I, PTSD presents for abdominal pain.  Patient reports has been having abdominal pain for several weeks and at times it radiates to her back.  No fevers or vomiting.  She will have bouts of diarrhea due to previous history of rectal cancer. She has been seen by PCP and her gastroenterologist but is not having any relief at home.  It is disrupting her daily routine. She has had multiple surgeries before including gastric bypass, hernia repair and cholecystectomy   Past Medical History:  Diagnosis Date   Anxiety    Bipolar 1 disorder (HCC)    Borderline personality disorder (HCC)    Depression    PTSD (post-traumatic stress disorder)     Home Medications Prior to Admission medications   Medication Sig Start Date End Date Taking? Authorizing Provider  cholestyramine (QUESTRAN) 4 g packet Take 4 g by mouth 3 (three) times daily. 07/24/23 10/22/23  [provider]  FLUoxetine (PROZAC) 20 MG capsule Take 1 capsule (20 mg total) by mouth at bedtime. 09/06/23   Elsie Lincoln, MD  lamoTRIgine (LAMICTAL) 200 MG tablet Take 1 tablet (200 mg total) by mouth daily. 07/01/23   Elsie Lincoln, MD  lithium carbonate 300 MG capsule Take 1 capsule (300 mg total) by mouth at bedtime. 07/26/23   Elsie Lincoln, MD  loperamide (IMODIUM A-D) 2 MG tablet Take 2 mg by mouth 3 (three) times daily as needed for diarrhea or loose stools.    [provider]  Multiple Vitamin (MULTIVITAMIN) tablet Take 1 tablet by mouth daily.    [provider]  propranolol (INDERAL) 10 MG tablet Take 1 tablet (10 mg total) by mouth 2 (two) times daily as needed (Anxiety/panic). 07/01/23    Elsie Lincoln, MD  QUEtiapine (SEROQUEL) 200 MG tablet Take 1 tablet (200 mg total) by mouth at bedtime. 07/01/23 09/29/23  Elsie Lincoln, MD      Allergies    Lisinopril    Review of Systems   Review of Systems  Respiratory:  Negative for shortness of breath.   Cardiovascular:  Negative for chest pain.  Gastrointestinal:  Positive for abdominal pain. Negative for vomiting.  Genitourinary:  Negative for dysuria.    Physical Exam Updated Vital Signs BP (!) 166/86 (BP Location: Right Arm)   Pulse 66   Temp 98.2 F (36.8 C) (Oral)   Resp 16   Ht 1.676 m (5\' 6" )   Wt 86 kg   SpO2 98%   BMI 30.60 kg/m  Physical Exam CONSTITUTIONAL: Well developed/well nourished HEAD: Normocephalic/atraumatic ENMT: Mucous membranes moist NECK: supple no meningeal signs CV: S1/S2 noted, no murmurs/rubs/gallops noted LUNGS: Lungs are clear to auscultation bilaterally, no apparent distress ABDOMEN: soft, mild diffuse tenderness, no rebound or guarding, bowel sounds noted throughout abdomen GU:no cva tenderness NEURO: Pt is awake/alert/appropriate, moves all extremitiesx4.  No facial droop.   EXTREMITIES: pulses normal/equal, full ROM SKIN: warm, color normal PSYCH: no abnormalities of mood noted, alert and oriented to situation  ED Results / Procedures / Treatments   Labs (all labs ordered are listed, but only abnormal results are displayed) Labs Reviewed  COMPREHENSIVE METABOLIC PANEL - Abnormal; Notable for the following components:      Result Value   Calcium 8.6 (*)    All other components within normal limits  CBC - Abnormal; Notable for the following components:   Hemoglobin 11.0 (*)    HCT 35.4 (*)    All other components within normal limits  URINALYSIS, ROUTINE W REFLEX MICROSCOPIC - Abnormal; Notable for the following components:   APPearance HAZY (*)    Hgb urine dipstick SMALL (*)    Leukocytes,Ua MODERATE (*)    All other components within normal limits  LIPASE,  BLOOD    EKG EKG Interpretation Date/Time:  Sunday September 22 2023 03:18:20 EDT Ventricular Rate:  58 PR Interval:  194 QRS Duration:  103 QT Interval:  463 QTC Calculation: 455 R Axis:   11  Text Interpretation: Sinus rhythm Low voltage, precordial leads Abnormal R-wave progression, early transition No significant change since last tracing Confirmed by Zadie Rhine ([id number]) on 09/22/2023 3:21:15 AM  Radiology No results found.  Procedures Procedures    Medications Ordered in ED Medications  HYDROcodone-acetaminophen (NORCO/VICODIN) 5-325 MG per tablet 1 tablet (1 tablet Oral Given 09/22/23 0315)    ED Course/ Medical Decision Making/ A&P Clinical Course as of 09/22/23 0354  Sun Sep 22, 2023  0321 Patient presents for continued abdominal pain for weeks.  It is not acutely worse tonight.  Labs overall unremarkable.  She already had a recent CT imaging. Is already been seen by gastroenterology.  Patient can continue outpatient evaluation [DW]    Clinical Course User Index [DW] Zadie Rhine, MD                                 Medical Decision Making Amount and/or Complexity of Data Reviewed Labs: ordered. ECG/medicine tests: ordered.  Risk Prescription drug management.   This patient presents to the ED for concern of abdominal pain, this involves an extensive number of treatment options, and is a complaint that carries with it a high risk of complications and morbidity.  The differential diagnosis includes but is not limited to pancreatitis, gastritis, peptic ulcer disease, appendicitis, bowel obstruction, bowel perforation, diverticulitis, AAA, ischemic bowel Acute coronary syndrome  Comorbidities that complicate the patient evaluation: Patient's presentation is complicated by their history of previous abdominal surgeries   Additional history obtained: Records reviewed  GI records reviewed  Lab Tests: I Ordered, and personally interpreted labs.  The  pertinent results include: Labs overall unremarkable  Medicines ordered and prescription drug management: I ordered medication including Vicodin for pain Reevaluation of the patient after these medicines showed that the patient    improved    Reevaluation: After the interventions noted above, I reevaluated the patient and found that they have :improved  Complexity of problems addressed: Patient's presentation is most consistent with  exacerbation of chronic illness  Disposition: After consideration of the diagnostic results and the patient's response to treatment,  I feel that the patent would benefit from discharge   .           Final Clinical Impression(s) / ED Diagnoses Final diagnoses:  Generalized abdominal pain    Rx / DC Orders ED Discharge Orders     None         Zadie Rhine, MD 09/22/23 [id number]

## 2023-09-22 NOTE — Discharge Instructions (Signed)

## 2023-09-26 ENCOUNTER — Other Ambulatory Visit (HOSPITAL_COMMUNITY): Payer: Self-pay | Admitting: Psychiatry

## 2023-09-26 DIAGNOSIS — F603 Borderline personality disorder: Secondary | ICD-10-CM

## 2023-09-26 DIAGNOSIS — F319 Bipolar disorder, unspecified: Secondary | ICD-10-CM

## 2023-09-26 DIAGNOSIS — F332 Major depressive disorder, recurrent severe without psychotic features: Secondary | ICD-10-CM

## 2023-09-27 ENCOUNTER — Other Ambulatory Visit (HOSPITAL_COMMUNITY): Payer: Self-pay | Admitting: Psychiatry

## 2023-09-27 DIAGNOSIS — F319 Bipolar disorder, unspecified: Secondary | ICD-10-CM

## 2023-09-27 DIAGNOSIS — F410 Panic disorder [episodic paroxysmal anxiety] without agoraphobia: Secondary | ICD-10-CM

## 2023-09-27 DIAGNOSIS — F603 Borderline personality disorder: Secondary | ICD-10-CM

## 2023-10-03 ENCOUNTER — Encounter (HOSPITAL_COMMUNITY): Payer: Self-pay | Admitting: Psychiatry

## 2023-10-03 ENCOUNTER — Telehealth (HOSPITAL_COMMUNITY): Payer: Medicaid Other | Admitting: Psychiatry

## 2023-10-03 DIAGNOSIS — F411 Generalized anxiety disorder: Secondary | ICD-10-CM

## 2023-10-03 DIAGNOSIS — G4709 Other insomnia: Secondary | ICD-10-CM

## 2023-10-03 DIAGNOSIS — F410 Panic disorder [episodic paroxysmal anxiety] without agoraphobia: Secondary | ICD-10-CM | POA: Diagnosis not present

## 2023-10-03 DIAGNOSIS — F319 Bipolar disorder, unspecified: Secondary | ICD-10-CM

## 2023-10-03 DIAGNOSIS — F4310 Post-traumatic stress disorder, unspecified: Secondary | ICD-10-CM | POA: Diagnosis not present

## 2023-10-03 DIAGNOSIS — F603 Borderline personality disorder: Secondary | ICD-10-CM | POA: Diagnosis not present

## 2023-10-03 DIAGNOSIS — F332 Major depressive disorder, recurrent severe without psychotic features: Secondary | ICD-10-CM

## 2023-10-03 DIAGNOSIS — Z79899 Other long term (current) drug therapy: Secondary | ICD-10-CM

## 2023-10-03 MED ORDER — FLUOXETINE HCL 20 MG PO CAPS
20.0000 mg | ORAL_CAPSULE | Freq: Every day | ORAL | 2 refills | Status: DC
Start: 2023-10-03 — End: 2023-11-15

## 2023-10-03 MED ORDER — LAMOTRIGINE 200 MG PO TABS
200.0000 mg | ORAL_TABLET | Freq: Every day | ORAL | 2 refills | Status: DC
Start: 2023-10-03 — End: 2023-12-13

## 2023-10-03 MED ORDER — PROPRANOLOL HCL 10 MG PO TABS
10.0000 mg | ORAL_TABLET | Freq: Two times a day (BID) | ORAL | 2 refills | Status: DC | PRN
Start: 2023-10-03 — End: 2023-12-31

## 2023-10-03 NOTE — Progress Notes (Signed)
BH MD Outpatient Progress Note  10/03/2023 10:07 AM Barbara Meyer  MRN:  [id number]  Assessment:  Barbara Meyer presents for follow-up evaluation. Today, 10/03/23, patient reports improvement to both anxiety and depression in the setting of titration of Prozac and having a distraction with worsening abdominal pain.  She has seen multiple GI providers but no further insights into what could be going on yet.  While this has been frustrating, she recognizes be helped this is provided with being a distraction from her mental health concerns.  Reports that her last suicidal ideation was after last appointment and did not continue or take any steps towards it.  She is still looking forward to her son's upcoming wedding which she will be for on Monday which will have some time off of work so she is still forward thinking and has never had a suicide attempt in the past despite many years of borderline personality disorder.    She was amenable to keeping doses of medication the same for the time being while you are trying to figure out her GI problems.  Still taking Prozac at night with sleeping about 10 hours a night.  Follow-up with PCP she unfortunately did not get the thyroid studies that we were looking for. She also needs lipid panel, A1c, EKG while on Seroquel.  Binge eating still roughly the same but self-harm slightly improved with not noticing the scratching sensation anymore which is an improvement.  Until suicidal ideation has been resolved for a longer period of time we will need to keep the number of pills on hand at a low amount.  Her antipsychotic monitoring as well, she does have an intention tremor of both hands and will need to continue to monitor.  Follow-up in 1 month.   For safety, her acute risk factors for suicide are: Diagnosis of borderline personality disorder, current diagnosis of depression, self-harm by scratching, suicidal ideation without intent or plan.  Her chronic risk factors for  suicide are: History of substance use, history of alcohol use disorder, historical diagnosis of bipolar disorder, diagnosed borderline personality disorder, social isolation.  Her protective factors are: Employment, supportive family, beloved pets in the home, actively seeking and engaging with mental health care no suicidal ideation, no access to firearms, hope for the future.  While future events cannot be fully predicted she does not currently meet IVC criteria and can be continued as an outpatient.  Identifying Information: Barbara Meyer is a 62 y.o. 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 Initial evaluation of depression and anxiety on 07/01/23; please see that note for full case formulation.  Patient reported past heavy use of both alcohol and methamphetamines during the same timeframe in which she received a bipolar 1 diagnosis many years ago.  Unfortunately do not have records from that time when she was living in [place] having only briefly moved to [place] in roughly 2022.  She carried a historical diagnosis of bipolar 1 disorder as well as borderline personality disorder and PTSD but at time of  initial appointment did not meet symptom burden of PTSD or bipolar disorder.  Based on substance use history it is far more likely that substance-induced mood disorder was more accurate but will continue to assess over  time to make sure she does not have bipolar 1 disorder.  With this in mind she also was unable to say why she was on the medication regimen that she presented with but regardless it did not appear to be helping her overall levels of depression and anxiety.  She was extremely dysphoric during assessment and frequently tearful with some hopelessness around her current state.  She had many panic attacks throughout the week which seem to be getting worse with a possible promotion at work.  She had some concerns that her memory was impaired which could be more acutely reflective of the amount of Imodium and atropine containing antidiarrheals that she was taking well beyond the prescribed norm.  She has an upcoming appointment with GI but encouraged her to reach out to PCP in the meantime as she was also taking Pepto-Bismol on top of this.  Ultimately with PCP to get updated monitoring labs while on long-term lithium, Seroquel, Lamictal and try to check a vitamin D level.  She previously had a gastric bypass surgery but has put on 75 pounds since moving to [place] and appears to be frequently binge eating.  Started Prozac as next agent is as good data for binge eating disorder as well as generalized anxiety with panic and depressive disorder.  We will plan on discontinuing lithium as next agent as at its current dose it is unlikely to be providing much benefit anyway.  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  Have placed referral for psychotherapy.   Was able to get a lithium level that was drawn by gastroenterologist and found to be 0.4 but as previously discussed much lower likelihood that she has a bipolar spectrum of illness and much more likely this is a primary borderline personality disorder which would not require higher doses of lithium.   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 caffeine use and history of snoring Past medication trials:  Status of problem: Improving Interventions: -- Patient to cut back on caffeine use --Seroquel, Prozac as above   # History of vitamin D and iron deficiency with history of gastric bypass surgery Past medication trials:  Status of problem: Chronic and stable Interventions: -- Coordinate with PCP for vitamin D level and consider nutrition referral   # High risk medication  use: Lithium and Lamictal Past medication trials:  Status of problem: Chronic and stable Interventions: --Lithium level on 07/24/2023 0.4, CMP within normal limits on 08/23/23 -- Coordinate with PCP to get updated TSH with total T3 and free T4   # Long-term current use of antipsychotic: Seroquel Past medication trials:  Status of problem: Chronic and stable Interventions: -- Coordinate with PCP to get updated lipid panel, A1c, EKG  # Abdominal pain Past medication trials:  Status of  problem: Worsening Interventions: -- Currently followed by GI --Counseled on cutting back on acetaminophen while on tramadol acetaminophen   # History of tobacco use disorder in sustained remission Past medication trials:  Status of problem: In remission Interventions: -- Continue to encourage abstinence  Patient was given contact information for behavioral health clinic and was instructed to call 911 for emergencies.   Subjective:  Chief Complaint:  Chief Complaint  Patient presents with   Trauma   Anxiety   Depression   Follow-up    Interval History: Feeling better compared to last appointment. Not having the same dark thoughts and thinks other issues going on have been a helpful distraction. Thinks the SI and self harm thoughts last came up after last appointment. Pain in her stomach has been extending to her back and trying to handle that. Will be going to son's wedding on Monday. Worried with the stomach pain which is taking her breath away and still not having answers for what is causing it. Tramadol wasn't helpful. Ok with keeping medication the same. Still getting around 10hrs per night. Discussed tremor of hands less than 10 years. Worsens with intention. Noticing some thigh pain and encouraged her to follow up with her PCP about this. Eating still roughly the same and when waking during the night will eat snacks.  Visit Diagnosis:    ICD-10-CM   1. PTSD (post-traumatic stress disorder)  F43.10     2. Generalized anxiety disorder with panic attacks  F41.1 propranolol (INDERAL) 10 MG tablet   F41.0 FLUoxetine (PROZAC) 20 MG capsule    3. Borderline personality disorder (HCC)  F60.3 lamoTRIgine (LAMICTAL) 200 MG tablet    4. History of bipolar disorder  F31.9 lamoTRIgine (LAMICTAL) 200 MG tablet    5. Major depressive disorder, recurrent severe without psychotic features (HCC)  F33.2 lamoTRIgine (LAMICTAL) 200 MG tablet    FLUoxetine (PROZAC) 20 MG capsule    6.  Long-term use of high-risk medication: Lithium  Z79.899     7. Long term current use of antipsychotic medication  Z79.899     8. 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, history of alcohol use disorder in sustained remission, history of tobacco use disorder in sustained remission Medication trials: effexor (doesn't remember), lithium (hard to say if working), seroquel (hard to say if working), latuda (hard to say if worked), trazodone (doesn't remember), hydroxyzine (doesn't remember), lamictal (doesn't remember), propranolol, wellbutrin (felt like being on meth again and was more isolative), Prozac (effective) Previous psychiatrist/therapist: yes to both Hospitalizations: none Suicide attempts: none SIB: scratches at times on arms Hx of violence towards others: none Current access to guns: none Hx of trauma/abuse: none Substance use: No alcohol at  present, has been 25 years since last drink as a recovering alcoholic since 1999. No tobacco products since in roughly 2002. No other drugs at present. Quit methamphetamine in 1999 as well; used IV and was treated for hepatitis C.   Past Medical History:  Past Medical History:  Diagnosis Date   Anxiety    Bipolar 1 disorder (HCC)    Borderline personality disorder (HCC)    Depression    PTSD (post-traumatic stress disorder)     Past Surgical History:  Procedure Laterality Date   GASTRIC BYPASS  10/2019   HERNIA REPAIR  2022   hernia repair and mass removal   WRIST FRACTURE SURGERY Left 12/20/2020    Family Psychiatric History: son on medication for bipolar and depression,  mother/father alcoholic   Family History:  Family History  Problem Relation Age of Onset   Brain cancer Brother    Rectal cancer Maternal Uncle        met to bones    Social History:  Academic/Vocational: Works at [employer]   Social History   Socioeconomic History   Marital status: Single    Spouse name: Not on file   Number of children: Not on file   Years of education: Not on file   Highest education level: Not on file  Occupational History   Not on file  Tobacco Use   Smoking status: Former    Current packs/day: 0.00    Types: Cigarettes    Quit date: 2002    Years since quitting: 22.7   Smokeless tobacco: Never  Vaping Use   Vaping status: Never Used  Substance and Sexual Activity   Alcohol use: Not Currently    Comment: Sober since 1999   Drug use: Not Currently    Types: Methamphetamines    Comment: Sober since 1999   Sexual activity: Not Currently  Other Topics Concern   Not on file  Social History Narrative   Not on file   Social Determinants of Health   Financial Resource Strain: Low Risk  (09/20/2021)   Overall Financial Resource Strain (CARDIA)    Difficulty of Paying Living Expenses: Not hard at all  Food Insecurity: No Food Insecurity (09/20/2021)   Hunger Vital Sign    Worried About Running Out of Food in the Last Year: Never true    Ran Out of Food in the Last Year: Never true  Transportation Needs: No Transportation Needs (09/20/2021)   PRAPARE - Administrator, Civil Service (Medical): No    Lack of Transportation (Non-Medical): No  Physical Activity: Inactive (09/20/2021)   Exercise Vital Sign    Days of Exercise per Week: 0 days    Minutes of Exercise per Session: 0 min  Stress: Stress Concern Present (09/20/2021)   [employer] of Occupational Health - Occupational Stress Questionnaire    Feeling of Stress : To some extent  Social Connections: Moderately Isolated (09/20/2021)   Social Connection and Isolation Panel  [NHANES]    Frequency of Communication with Friends and Family: More than three times a week    Frequency of Social Gatherings with Friends and Family: More than three times a week    Attends Religious Services: More than 4 times per year    Active Member of [employer] or Organizations: No    Attends Banker Meetings: Never    Marital Status: Never married    Allergies:  Allergies  Allergen Reactions   Lisinopril Cough    Current  Medications: Current Outpatient Medications  Medication Sig Dispense Refill   traMADol-acetaminophen (ULTRACET) 37.5-325 MG tablet Take 1 tablet by mouth every 6 (six) hours as needed for severe pain.     acetaminophen (TYLENOL) 500 MG tablet Take 1,500 mg by mouth 2 (two) times daily as needed for moderate pain.     cholestyramine (QUESTRAN) 4 g packet Take 4 g by mouth 3 (three) times daily.     FLUoxetine (PROZAC) 20 MG capsule Take 1 capsule (20 mg total) by mouth at bedtime. 30 capsule 2   lamoTRIgine (LAMICTAL) 200 MG tablet Take 1 tablet (200 mg total) by mouth daily. 30 tablet 2   lithium carbonate 300 MG capsule Take 1 capsule (300 mg total) by mouth at bedtime. 30 capsule 2   loperamide (IMODIUM A-D) 2 MG tablet Take 2 mg by mouth 3 (three) times daily as needed for diarrhea or loose stools.     Multiple Vitamin (MULTIVITAMIN) tablet Take 1 tablet by mouth daily.     propranolol (INDERAL) 10 MG tablet Take 1 tablet (10 mg total) by mouth 2 (two) times daily as needed (Anxiety/panic). 60 tablet 2   QUEtiapine (SEROQUEL) 200 MG tablet Take 1 tablet (200 mg total) by mouth at bedtime. 30 tablet 2   No current facility-administered medications for this visit.    ROS: Review of Systems  Constitutional:  Positive for appetite change and unexpected weight change.  HENT:         Dental implants  Gastrointestinal:  Positive for abdominal pain and diarrhea. Negative for constipation, nausea and vomiting.  Endocrine: Positive for cold  intolerance, heat intolerance and polyphagia.  Musculoskeletal:  Positive for back pain. Negative for arthralgias.  Skin:        No hair loss  Neurological:  Positive for headaches. Negative for dizziness.  Psychiatric/Behavioral:  Positive for decreased concentration, dysphoric mood, self-injury and suicidal ideas. Negative for hallucinations and sleep disturbance. The patient is nervous/anxious.     Objective:  Psychiatric Specialty Exam: There were no vitals taken for this visit.There is no height or weight on file to calculate BMI.  General Appearance: Casual, Fairly Groomed, and appears stated age  Eye Contact:  Fair  Speech:  Normal Rate and slight impairment to articulation with dental implants  Volume:  Normal  Mood:   "I in doing better"  Affect:  Appropriate, Congruent, Depressed, and but significantly closer to euthymia with no tearfulness and not overly anxious  Thought Content: Logical and Hallucinations: Tactile   Suicidal Thoughts:  No but last present in September  Homicidal Thoughts:  No  Thought Process:  Coherent, Goal Directed, and Linear  Orientation:  Full (Time, Place, and Person)    Memory:  Grossly intact   Judgment:  Fair  Insight:  Fair  Concentration:  Concentration: Fair  Recall:  not formally assessed   Fund of Knowledge: Fair  Language: Fair  Psychomotor Activity:  Increased and constantly moving  Akathisia:  No  AIMS (if indicated): Done, 0 as tremor of bilateral hands not included  Assets:  Communication Skills Desire for Improvement Financial Resources/Insurance Housing Leisure Time Resilience Social Support Talents/Skills Transportation Vocational/Educational  ADL's:  Intact  Cognition: WNL  Sleep:  Fair   PE: General: sits comfortably in view of camera; no acute distress Pulm: no increased work of breathing on room air  MSK: all extremity movements appear intact  Neuro: no focal neurological deficits observed  Gait & Station:  unable to assess by video  Metabolic Disorder Labs: No results found for: "HGBA1C", "MPG" No results found for: "PROLACTIN" No results found for: "CHOL", "TRIG", "HDL", "CHOLHDL", "VLDL", "LDLCALC" No results found for: "TSH"  Therapeutic Level Labs: No results found for: "LITHIUM" No results found for: "VALPROATE" No results found for: "CBMZ"  Screenings:  PHQ2-9    Flowsheet Row Office Visit from 07/01/2023 in [facility] [place] Office Visit from 09/20/2021 in [facility]  PHQ-2 Total Score 6 2  PHQ-9 Total Score 18 6      Flowsheet Row ED from 09/21/2023 in [facility] ED from 08/23/2023 in [facility] at [facility] Office Visit from 07/01/2023 in [facility] [place]  C-SSRS RISK CATEGORY No Risk No Risk No Risk       Collaboration of Care: Collaboration of Care: Medication Management AEB as above and Primary Care Provider AEB as above  Patient/Guardian was advised Release of Information must be obtained prior to any record release in order to collaborate their care with an outside provider. Patient/Guardian was advised if they have not already done so to contact the registration department to sign all necessary forms in order for [place] to release information regarding their care.   Consent: Patient/Guardian gives verbal consent for treatment and assignment of benefits for services provided during this visit. Patient/Guardian expressed understanding and agreed to proceed.   Televisit via video: I connected with patient on 10/03/23 at  9:30 AM EDT by a video enabled telemedicine application and verified that I am speaking with the correct person using two identifiers.  Location: Patient: At work at the [employer]: remote office in [place]   I discussed the limitations of evaluation and  management by telemedicine and the availability of in person appointments. The patient expressed understanding and agreed to proceed.  I discussed the assessment and treatment plan with the patient. The patient was provided an opportunity to ask questions and all were answered. The patient agreed with the plan and demonstrated an understanding of the instructions.   The patient was advised to call back or seek an in-person evaluation if the symptoms worsen or if the condition fails to improve as anticipated.  I provided 20 minutes dedicated to the care of this patient via video on the date of this encounter to include chart review, face-to-face time with the patient, medication management/counseling, coordination of care with PCP.  Elsie Lincoln, MD 10/03/2023, 10:07 AM

## 2023-10-03 NOTE — Patient Instructions (Signed)
We did not make any medication changes today.  I would let your PCP know about the thigh pain you are experiencing and we will continue to monitor that tremor of your hands.  I would also cut back on the amount of Tylenol you are taking while you are taking the tramadol that has Tylenol in it.

## 2023-10-31 ENCOUNTER — Encounter (HOSPITAL_COMMUNITY): Payer: Self-pay | Admitting: Psychiatry

## 2023-10-31 ENCOUNTER — Telehealth (HOSPITAL_COMMUNITY): Payer: Medicaid Other | Admitting: Psychiatry

## 2023-10-31 DIAGNOSIS — F411 Generalized anxiety disorder: Secondary | ICD-10-CM

## 2023-10-31 DIAGNOSIS — F332 Major depressive disorder, recurrent severe without psychotic features: Secondary | ICD-10-CM | POA: Diagnosis not present

## 2023-10-31 DIAGNOSIS — R45851 Suicidal ideations: Secondary | ICD-10-CM

## 2023-10-31 DIAGNOSIS — G4709 Other insomnia: Secondary | ICD-10-CM

## 2023-10-31 DIAGNOSIS — F4310 Post-traumatic stress disorder, unspecified: Secondary | ICD-10-CM | POA: Diagnosis not present

## 2023-10-31 DIAGNOSIS — F410 Panic disorder [episodic paroxysmal anxiety] without agoraphobia: Secondary | ICD-10-CM

## 2023-10-31 DIAGNOSIS — F603 Borderline personality disorder: Secondary | ICD-10-CM | POA: Diagnosis not present

## 2023-10-31 DIAGNOSIS — Z79899 Other long term (current) drug therapy: Secondary | ICD-10-CM

## 2023-10-31 NOTE — Patient Instructions (Signed)
We did not make any medication changes today.  When you get a chance, please coordinate with your PCP to get an updated TSH with total T3 and free T4 along with an updated A1c and lipid panel for monitoring while you are on lithium and Seroquel respectively.

## 2023-10-31 NOTE — Progress Notes (Signed)
BH MD Outpatient Progress Note  10/31/2023 10:16 AM Barbara Meyer  MRN:  [id number]  Assessment:  Barbara Meyer presents for follow-up evaluation. Today, 10/31/23, patient reports overall stability with slight exacerbation of both anxiety and depression in the setting of relationship injury from her family asking for money and not sending Halloween pictures of her grandchildren.  Overall still consistent with borderline personality disorder but did offer titration of Prozac as this had led to significant improvement to both anxiety and depression and suicidal ideation.  On that note, this is still occurring and usually in response to relationship injury or when hopeless about ongoing abdominal and back pain.  Still without plan and not taking any steps towards dying or self-harm.  She preferred to keep doses of medication the same for the time being but will reconsider in the next 2 weeks.  Still taking Prozac at night with sleeping about 10 hours a night.  Follow-up with PCP she unfortunately did not get the thyroid studies that we were looking for. She also needs lipid panel, A1c while on Seroquel.  Binge eating still roughly the same but self-harm slightly improved with not noticing the scratching sensation anymore which is an improvement.  Until suicidal ideation has been resolved for a longer period of time we will need to keep the number of pills on hand at a low amount.  Her antipsychotic monitoring as well, she does have an intention tremor of both hands and will need to continue to monitor that does appear to be most consistent with intention tremor.  Follow-up in 2 weeks.   For safety, her acute risk factors for suicide are: Diagnosis of borderline personality disorder, current diagnosis of depression, self-harm by scratching, suicidal ideation without intent or plan.  Her chronic risk factors for suicide are: History of substance use, history of alcohol use disorder, historical diagnosis of  bipolar disorder, diagnosed borderline personality disorder, social isolation.  Her protective factors are: Employment, supportive family, beloved pets in the home, actively seeking and engaging with mental health care no suicidal ideation, no access to firearms, hope for the future.  While future events cannot be fully predicted she does not currently meet IVC criteria and can be continued as an outpatient.  Identifying Information: Barbara Meyer is a 62 y.o. 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 Initial evaluation of depression and anxiety on 07/01/23; please see that note for full case formulation.  We will plan on discontinuing lithium as next agent as at its current dose it is unlikely to be providing much benefit anyway.  Was able to get a lithium level that was drawn by gastroenterologist and found to be 0.4 but as previously discussed much lower likelihood that she has a bipolar spectrum of illness and much more likely this is a primary borderline personality disorder which would not require higher doses of lithium.   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 caffeine use and history of snoring Past medication trials:  Status of problem: Improving Interventions: -- Patient to cut back on caffeine use --Seroquel, Prozac as above   # History of vitamin D and iron deficiency with history of gastric bypass surgery Past medication trials:  Status of problem: Chronic and stable Interventions: -- Coordinate with PCP for vitamin D level and consider nutrition referral   # High risk medication use: Lithium and Lamictal Past medication trials:  Status of problem: Chronic and stable Interventions: --Lithium level on 07/24/2023 0.4, CMP within normal limits on 08/23/23 -- Coordinate with PCP to get updated TSH with total T3 and free T4   # Long-term current use of antipsychotic: Seroquel Past medication trials:  Status of problem: Chronic and stable Interventions: -- EKG with QTc of 455 ms on 09/23/2023 --Still needs updated A1c and lipid panel  # Abdominal pain Past medication trials:  Status of problem: Worsening Interventions: -- Currently followed by GI   # History of tobacco use disorder in sustained remission Past medication trials:  Status  of problem: In remission Interventions: -- Continue to encourage abstinence  Patient was given contact information for behavioral health clinic and was instructed to call 911 for emergencies.   Subjective:  Chief Complaint:  Chief Complaint  Patient presents with   Borderline personality disorder   Depression   Anxiety   Follow-up   Stress    Interval History: Micah Flesher to son's wedding in [place] and saw other son in [place] and grandkids. Unfortunately completely drained her savings and maxed out a credit card. Has been at home and going to work. Still having the bad stomach pain and had CT scan with back xrays but still not sure where the issue is coming from. May be a tear in her stomach lining. Asked her PCP for pain medication but GI eventually agreed to provide 5 days worth of tramadol. It has since run out so still having pain. Has been taking the propranolol every morning and thinks it has been helpful for anxiety. Does still notice the intention tremor. Does still have some depression related to the above. Does think the SI and self harm thoughts are getting better, can still go down hill if she thinks about the above too much or gets hopeless with it. Main thought is if she weren't here that it would be better. Has regret over the impulsive spending on her grandchildren especially when son didn't send pictures of her grandchildren. Also still hurt that after moving across the country they didn't come visit when she had stage 3 rectal cancer. Ok with keeping medication the same. Still getting around 10hrs per night. Eating still roughly the  same and when waking during the night will eat snacks.  Visit Diagnosis:    ICD-10-CM   1. Borderline personality disorder (HCC)  F60.3     2. Major depressive disorder, recurrent severe without psychotic features (HCC)  F33.2     3. PTSD (post-traumatic stress disorder)  F43.10     4. Suicidal ideation without plan or intent  R45.851     5.  Long-term use of high-risk medication: Lithium  Z79.899     6. Long term current use of antipsychotic medication  Z79.899     7. Insomnia with night caffeine use  G47.09     8. 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, history of alcohol use disorder in sustained remission, history of tobacco use disorder in sustained remission Medication trials: effexor (doesn't remember), lithium (hard to say if working), seroquel (hard to say if working), latuda (hard to say if worked), trazodone (doesn't remember), hydroxyzine (doesn't remember), lamictal (doesn't remember), propranolol, wellbutrin (felt like being on meth again and was more isolative), Prozac (effective) Previous psychiatrist/therapist: yes to both Hospitalizations: none Suicide attempts: none SIB: scratches at times on arms Hx of violence towards others: none Current access to guns: none Hx of trauma/abuse: none Substance use: No alcohol at present, has been 25 years since last drink as a recovering alcoholic since 1999. No tobacco products since in roughly 2002. No other drugs at present. Quit methamphetamine in 1999 as well; used IV and was treated for hepatitis C.   Past Medical History:  Past Medical History:  Diagnosis Date   Anxiety    Bipolar 1 disorder (HCC)    Borderline personality disorder (HCC)    Depression    PTSD (post-traumatic stress disorder)     Past Surgical History:  Procedure Laterality Date   GASTRIC BYPASS  10/2019   HERNIA REPAIR  2022   hernia repair and mass removal   WRIST FRACTURE SURGERY Left 12/20/2020     Family Psychiatric History: son on medication for bipolar and depression, mother/father alcoholic   Family History:  Family History  Problem Relation Age of Onset   Brain cancer Brother    Rectal cancer Maternal Uncle        met to bones    Social History:  Academic/Vocational: Works at [employer]   Social History   Socioeconomic History   Marital status: Single    Spouse name: Not on file   Number of children: Not on file   Years of education: Not on file   Highest education level: Not on file  Occupational History   Not on file  Tobacco Use   Smoking status: Former    Current packs/day: 0.00    Types: Cigarettes    Quit date: 2002    Years since quitting: 22.8   Smokeless tobacco: Never  Vaping Use   Vaping status: Never Used  Substance and Sexual Activity   Alcohol use: Not Currently    Comment: Sober since 1999   Drug use: Not Currently    Types: Methamphetamines    Comment: Sober since 1999   Sexual activity: Not Currently  Other Topics Concern  Not on file  Social History Narrative   Not on file   Social Determinants of Health   Financial Resource Strain: Low Risk  (09/20/2021)   Overall Financial Resource Strain (CARDIA)    Difficulty of Paying Living Expenses: Not hard at all  Food Insecurity: No Food Insecurity (09/20/2021)   Hunger Vital Sign    Worried About Running Out of Food in the Last Year: Never true    Ran Out of Food in the Last Year: Never true  Transportation Needs: No Transportation Needs (09/20/2021)   PRAPARE - Administrator, Civil Service (Medical): No    Lack of Transportation (Non-Medical): No  Physical Activity: Inactive (09/20/2021)   Exercise Vital Sign    Days of Exercise per Week: 0 days    Minutes of Exercise per Session: 0 min  Stress: Stress Concern Present (09/20/2021)   [employer] of Occupational Health - Occupational Stress Questionnaire    Feeling of Stress : To some extent  Social  Connections: Moderately Isolated (09/20/2021)   Social Connection and Isolation Panel [NHANES]    Frequency of Communication with Friends and Family: More than three times a week    Frequency of Social Gatherings with Friends and Family: More than three times a week    Attends Religious Services: More than 4 times per year    Active Member of [employer] or Organizations: No    Attends Banker Meetings: Never    Marital Status: Never married    Allergies:  Allergies  Allergen Reactions   Lisinopril Cough    Current Medications: Current Outpatient Medications  Medication Sig Dispense Refill   hyoscyamine (LEVSIN SL) 0.125 MG SL tablet Place 0.125 mg under the tongue every 4 (four) hours as needed for cramping.     acetaminophen (TYLENOL) 500 MG tablet Take 1,500 mg by mouth 2 (two) times daily as needed for moderate pain.     cholestyramine (QUESTRAN) 4 g packet Take 4 g by mouth 3 (three) times daily.     FLUoxetine (PROZAC) 20 MG capsule Take 1 capsule (20 mg total) by mouth at bedtime. 30 capsule 2   lamoTRIgine (LAMICTAL) 200 MG tablet Take 1 tablet (200 mg total) by mouth daily. 30 tablet 2   lithium carbonate 300 MG capsule Take 1 capsule (300 mg total) by mouth at bedtime. 30 capsule 2   loperamide (IMODIUM A-D) 2 MG tablet Take 2 mg by mouth 3 (three) times daily as needed for diarrhea or loose stools.     Multiple Vitamin (MULTIVITAMIN) tablet Take 1 tablet by mouth daily.     propranolol (INDERAL) 10 MG tablet Take 1 tablet (10 mg total) by mouth 2 (two) times daily as needed (Anxiety/panic). 60 tablet 2   QUEtiapine (SEROQUEL) 200 MG tablet Take 1 tablet (200 mg total) by mouth at bedtime. 30 tablet 2   No current facility-administered medications for this visit.    ROS: Review of Systems  Constitutional:  Positive for appetite change and unexpected weight change.  HENT:         Dental implants  Gastrointestinal:  Positive for abdominal pain and diarrhea.  Negative for constipation, nausea and vomiting.  Endocrine: Positive for cold intolerance, heat intolerance and polyphagia.  Musculoskeletal:  Positive for back pain. Negative for arthralgias.  Skin:        No hair loss  Neurological:  Positive for headaches. Negative for dizziness.  Psychiatric/Behavioral:  Positive for decreased concentration, dysphoric mood, self-injury and  suicidal ideas. Negative for hallucinations and sleep disturbance. The patient is nervous/anxious.     Objective:  Psychiatric Specialty Exam: There were no vitals taken for this visit.There is no height or weight on file to calculate BMI.  General Appearance: Casual, Fairly Groomed, and appears stated age  Eye Contact:  Fair  Speech:  Normal Rate and slight impairment to articulation with dental implants  Volume:  Normal  Mood:   "Okay"  Affect:  Appropriate, Congruent, Depressed, and still no tearfulness and not overly anxious  Thought Content: Logical and Hallucinations: Tactile   Suicidal Thoughts:  None today but still intermittent and passive when occurring usually in response to relationship injury  Homicidal Thoughts:  No  Thought Process:  Coherent, Goal Directed, and Linear  Orientation:  Full (Time, Place, and Person)    Memory:  Grossly intact   Judgment:  Fair  Insight:  Fair  Concentration:  Concentration: Fair  Recall:  not formally assessed   Fund of Knowledge: Fair  Language: Fair  Psychomotor Activity:  Increased and constantly moving  Akathisia:  No  AIMS (if indicated): Done, 0 as tremor of bilateral hands not included  Assets:  Manufacturing systems engineer Desire for Improvement Financial Resources/Insurance Housing Leisure Time Resilience Social Support Talents/Skills Transportation Vocational/Educational  ADL's:  Intact  Cognition: WNL  Sleep:  Fair   PE: General: sits comfortably in view of camera; no acute distress Pulm: no increased work of breathing on room air  MSK: all  extremity movements appear intact  Neuro: no focal neurological deficits observed  Gait & Station: unable to assess by video    Metabolic Disorder Labs: No results found for: "HGBA1C", "MPG" No results found for: "PROLACTIN" No results found for: "CHOL", "TRIG", "HDL", "CHOLHDL", "VLDL", "LDLCALC" No results found for: "TSH"  Therapeutic Level Labs: No results found for: "LITHIUM" No results found for: "VALPROATE" No results found for: "CBMZ"  Screenings:  PHQ2-9    Flowsheet Row Office Visit from 07/01/2023 in [facility] [place] Office Visit from 09/20/2021 in [facility]  PHQ-2 Total Score 6 2  PHQ-9 Total Score 18 6      Flowsheet Row ED from 09/21/2023 in [facility] at [facility] ED from 08/23/2023 in [facility] at [facility] Office Visit from 07/01/2023 in [facility] at [place]  C-SSRS RISK CATEGORY No Risk No Risk No Risk       Collaboration of Care: Collaboration of Care: Medication Management AEB as above and Primary Care Provider AEB as above  Patient/Guardian was advised Release of Information must be obtained prior to any record release in order to collaborate their care with an outside provider. Patient/Guardian was advised if they have not already done so to contact the registration department to sign all necessary forms in order for [place] to release information regarding their care.   Consent: Patient/Guardian gives verbal consent for treatment and assignment of benefits for services provided during this visit. Patient/Guardian expressed understanding and agreed to proceed.   Televisit via video: I connected with patient on 10/31/23 at 10:00 AM EST by a video enabled telemedicine application and verified that I am speaking with the correct person using two identifiers.  Location: Patient: At work at the  [employer]: remote office in    I discussed the limitations of evaluation and management by telemedicine and the availability of in person appointments. The  patient expressed understanding and agreed to proceed.  I discussed the assessment and treatment plan with the patient. The patient was provided an opportunity to ask questions and all were answered. The patient agreed with the plan and demonstrated an understanding of the instructions.   The patient was advised to call back or seek an in-person evaluation if the symptoms worsen or if the condition fails to improve as anticipated.  I provided 25 minutes dedicated to the care of this patient via video on the date of this encounter to include chart review, face-to-face time with the patient, medication management/counseling, coordination of care with PCP.  Elsie Lincoln, MD 10/31/2023, 10:16 AM

## 2023-11-01 ENCOUNTER — Emergency Department (HOSPITAL_COMMUNITY)
Admission: EM | Admit: 2023-11-01 | Discharge: 2023-11-02 | Disposition: A | Discharge status: Home / Self Care | Payer: Medicaid Other | Attending: Emergency Medicine | Admitting: Emergency Medicine

## 2023-11-01 ENCOUNTER — Other Ambulatory Visit: Payer: Self-pay

## 2023-11-01 ENCOUNTER — Encounter (HOSPITAL_COMMUNITY): Payer: Self-pay

## 2023-11-01 DIAGNOSIS — R1030 Lower abdominal pain, unspecified: Secondary | ICD-10-CM | POA: Insufficient documentation

## 2023-11-01 DIAGNOSIS — G8929 Other chronic pain: Secondary | ICD-10-CM | POA: Insufficient documentation

## 2023-11-01 DIAGNOSIS — Z85048 Personal history of other malignant neoplasm of rectum, rectosigmoid junction, and anus: Secondary | ICD-10-CM | POA: Diagnosis not present

## 2023-11-01 LAB — COMPREHENSIVE METABOLIC PANEL
ALT: 13 U/L (ref 0–44)
AST: 19 U/L (ref 15–41)
Albumin: 3.9 g/dL (ref 3.5–5.0)
Alkaline Phosphatase: 94 U/L (ref 38–126)
Anion gap: 10 (ref 5–15)
BUN: 16 mg/dL (ref 8–23)
CO2: 22 mmol/L (ref 22–32)
Calcium: 8.9 mg/dL (ref 8.9–10.3)
Chloride: 106 mmol/L (ref 98–111)
Creatinine, Ser: 0.64 mg/dL (ref 0.44–1.00)
GFR, Estimated: >60 mL/min (ref >60)
Glucose, Bld: 99 mg/dL (ref 70–99)
Potassium: 4.1 mmol/L (ref 3.5–5.1)
Sodium: 138 mmol/L (ref 135–145)
Total Bilirubin: 0.3 mg/dL (ref <1.2)
Total Protein: 7.1 g/dL (ref 6.5–8.1)

## 2023-11-01 LAB — URINALYSIS, ROUTINE W REFLEX MICROSCOPIC
Bacteria, UA: NONE SEEN
Bilirubin Urine: NEGATIVE
Glucose, UA: NEGATIVE mg/dL
Ketones, ur: NEGATIVE mg/dL
Leukocytes,Ua: NEGATIVE
Nitrite: NEGATIVE
Protein, ur: 30 mg/dL — AB
Specific Gravity, Urine: 1.031 — ABNORMAL HIGH (ref 1.005–1.030)
pH: 5 (ref 5.0–8.0)

## 2023-11-01 LAB — CBC
HCT: 36.1 % (ref 36.0–46.0)
Hemoglobin: 11.2 g/dL — ABNORMAL LOW (ref 12.0–15.0)
MCH: 28.1 pg (ref 26.0–34.0)
MCHC: 31 g/dL (ref 30.0–36.0)
MCV: 90.7 fL (ref 80.0–100.0)
Platelets: 235 10*3/uL (ref 150–400)
RBC: 3.98 MIL/uL (ref 3.87–5.11)
RDW: 14.5 % (ref 11.5–15.5)
WBC: 6.6 10*3/uL (ref 4.0–10.5)
nRBC: 0 % (ref 0.0–0.2)

## 2023-11-01 LAB — LIPASE, BLOOD: Lipase: 24 U/L (ref 11–51)

## 2023-11-01 NOTE — ED Triage Notes (Signed)
ABD pain for several months across lower ABD Pt stated that she has had diarrhea since she completed chemo 2 years ago Hx of stage 3 rectal CA

## 2023-11-02 NOTE — ED Provider Notes (Signed)
[place] EMERGENCY DEPARTMENT AT [facility]  Provider Note  CSN: [id number] Arrival date & time: 11/01/23 2229  History Chief Complaint  Patient presents with   Abdominal Pain    Barbara Meyer is a 62 y.o. female with history of chronic abdominal pain, prior gastric bypass surgery and treatment for rectal cancer presents for about 10 days of lower abdominal pain, not associated with fever, N/V. Some diarrhea but no dysuria or vaginal discharge. She was seen by GI and had unremarkable colonoscopy on 10/25, no signs of recurrence of cancer. Got an Rx for Tramadol on 11/7 from PCP which she picked up today and states is not helping. She is specifically concerned that her pain is related to her prior bariatric surgery.    Home Medications Prior to Admission medications   Medication Sig Start Date End Date Taking? Authorizing Provider  acetaminophen (TYLENOL) 500 MG tablet Take 1,500 mg by mouth 2 (two) times daily as needed for moderate pain.    [provider]  cholestyramine (QUESTRAN) 4 g packet Take 4 g by mouth 3 (three) times daily. 07/24/23 10/22/23  [provider]  FLUoxetine (PROZAC) 20 MG capsule Take 1 capsule (20 mg total) by mouth at bedtime. 10/03/23   Elsie Lincoln, MD  hyoscyamine (LEVSIN SL) 0.125 MG SL tablet Place 0.125 mg under the tongue every 4 (four) hours as needed for cramping.    [provider]  lamoTRIgine (LAMICTAL) 200 MG tablet Take 1 tablet (200 mg total) by mouth daily. 10/03/23   Elsie Lincoln, MD  lithium carbonate 300 MG capsule Take 1 capsule (300 mg total) by mouth at bedtime. 09/26/23   Elsie Lincoln, MD  loperamide (IMODIUM A-D) 2 MG tablet Take 2 mg by mouth 3 (three) times daily as needed for diarrhea or loose stools.    [provider]  Multiple Vitamin (MULTIVITAMIN) tablet Take 1 tablet by mouth daily.    [provider]  propranolol (INDERAL) 10 MG tablet Take 1 tablet (10 mg  total) by mouth 2 (two) times daily as needed (Anxiety/panic). 10/03/23   Elsie Lincoln, MD  QUEtiapine (SEROQUEL) 200 MG tablet Take 1 tablet (200 mg total) by mouth at bedtime. 09/27/23   Elsie Lincoln, MD     Allergies    Lisinopril   Review of Systems   Review of Systems Please see HPI for pertinent positives and negatives  Physical Exam BP (!) 161/85   Pulse 72   Temp 98.6 F (37 C) (Oral)   Resp 18   Ht 5\' 6"  (1.676 m)   Wt 86.2 kg   SpO2 96%   BMI 30.67 kg/m   Physical Exam Vitals and nursing note reviewed.  Constitutional:      Appearance: Normal appearance.  HENT:     Head: Normocephalic and atraumatic.     Nose: Nose normal.     Mouth/Throat:     Mouth: Mucous membranes are moist.  Eyes:     Extraocular Movements: Extraocular movements intact.     Conjunctiva/sclera: Conjunctivae normal.  Cardiovascular:     Rate and Rhythm: Normal rate.  Pulmonary:     Effort: Pulmonary effort is normal.     Breath sounds: Normal breath sounds.  Abdominal:     General: Abdomen is flat.     Palpations: Abdomen is soft.     Tenderness: There is abdominal tenderness in the suprapubic area. There is no guarding or rebound. Negative signs include  Murphy's sign and McBurney's sign.  Musculoskeletal:        General: No swelling. Normal range of motion.     Cervical back: Neck supple.  Skin:    General: Skin is warm and dry.  Neurological:     General: No focal deficit present.     Mental Status: She is alert.  Psychiatric:        Mood and Affect: Mood normal.     ED Results / Procedures / Treatments   EKG None  Procedures Procedures  Medications Ordered in the ED Medications - No data to display  Initial Impression and Plan  Patient here for abdominal pain, history of same. Vitals and exam are reassuring, no peritoneal signs. No symptoms to suggest SBO. She had labs done in triage showing unremarkable CBC, CMP, Lipase and UA without signs of UTI. Given  duration of symptoms, reassuring exam and labs, imaging is not indicated at this time. She was offered a dose of oral pain medication when she is able to secure a ride home.   ED Course   Clinical Course as of 11/02/23 0130  Sat Nov 02, 2023  0127 Patient no longer in room, presumably she was unable to secure a ride home and has left before receiving her discharge instructions.  [CS]    Clinical Course User Index [CS] Pollyann Savoy, MD     MDM Rules/Calculators/A&P Medical Decision Making Problems Addressed: Chronic abdominal pain: chronic illness or injury with exacerbation, progression, or side effects of treatment  Amount and/or Complexity of Data Reviewed Labs: ordered. Decision-making details documented in ED Course.     Final Clinical Impression(s) / ED Diagnoses Final diagnoses:  Chronic abdominal pain    Rx / DC Orders ED Discharge Orders     None        Pollyann Savoy, MD 11/02/23 0130

## 2023-11-12 ENCOUNTER — Other Ambulatory Visit (HOSPITAL_COMMUNITY): Payer: Self-pay | Admitting: Psychiatry

## 2023-11-12 DIAGNOSIS — F332 Major depressive disorder, recurrent severe without psychotic features: Secondary | ICD-10-CM

## 2023-11-12 DIAGNOSIS — F319 Bipolar disorder, unspecified: Secondary | ICD-10-CM

## 2023-11-12 DIAGNOSIS — F603 Borderline personality disorder: Secondary | ICD-10-CM

## 2023-11-15 ENCOUNTER — Telehealth (INDEPENDENT_AMBULATORY_CARE_PROVIDER_SITE_OTHER): Payer: Medicaid Other | Admitting: Psychiatry

## 2023-11-15 ENCOUNTER — Encounter (HOSPITAL_COMMUNITY): Payer: Self-pay | Admitting: Psychiatry

## 2023-11-15 DIAGNOSIS — F603 Borderline personality disorder: Secondary | ICD-10-CM

## 2023-11-15 DIAGNOSIS — Z79899 Other long term (current) drug therapy: Secondary | ICD-10-CM

## 2023-11-15 DIAGNOSIS — R45851 Suicidal ideations: Secondary | ICD-10-CM

## 2023-11-15 DIAGNOSIS — F332 Major depressive disorder, recurrent severe without psychotic features: Secondary | ICD-10-CM | POA: Diagnosis not present

## 2023-11-15 DIAGNOSIS — F4310 Post-traumatic stress disorder, unspecified: Secondary | ICD-10-CM | POA: Diagnosis not present

## 2023-11-15 DIAGNOSIS — F410 Panic disorder [episodic paroxysmal anxiety] without agoraphobia: Secondary | ICD-10-CM

## 2023-11-15 DIAGNOSIS — F411 Generalized anxiety disorder: Secondary | ICD-10-CM | POA: Diagnosis not present

## 2023-11-15 MED ORDER — FLUOXETINE HCL 40 MG PO CAPS
40.0000 mg | ORAL_CAPSULE | Freq: Every day | ORAL | 2 refills | Status: DC
Start: 2023-11-15 — End: 2024-01-16
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
## 2023-11-15 NOTE — Patient Instructions (Signed)
We increased the Prozac (fluoxetine) to 40 mg nightly today.  I also put in the blood work order for your thyroid check, A1c, lipid panel, and a lithium level.  Just swing by my office on [address] in [place] and swing by my front desk and they can print off the order forms and point you in the direction of the lab that is in this building.

## 2023-12-01 ENCOUNTER — Other Ambulatory Visit (HOSPITAL_COMMUNITY): Payer: Self-pay | Admitting: Psychiatry

## 2023-12-01 DIAGNOSIS — F410 Panic disorder [episodic paroxysmal anxiety] without agoraphobia: Secondary | ICD-10-CM

## 2023-12-01 DIAGNOSIS — F603 Borderline personality disorder: Secondary | ICD-10-CM

## 2023-12-01 DIAGNOSIS — F319 Bipolar disorder, unspecified: Secondary | ICD-10-CM

## 2023-12-04 LAB — TSH+T4F+T3FREE
Free T4: 1.03 ng/dL (ref 0.82–1.77)
T3, Free: 2.4 pg/mL (ref 2.0–4.4)
TSH: 1.45 u[IU]/mL (ref 0.450–4.500)

## 2023-12-13 ENCOUNTER — Telehealth (HOSPITAL_COMMUNITY): Payer: Medicaid Other | Admitting: Psychiatry

## 2023-12-13 ENCOUNTER — Encounter (HOSPITAL_COMMUNITY): Payer: Self-pay | Admitting: Psychiatry

## 2023-12-13 DIAGNOSIS — Z79899 Other long term (current) drug therapy: Secondary | ICD-10-CM

## 2023-12-13 DIAGNOSIS — F4310 Post-traumatic stress disorder, unspecified: Secondary | ICD-10-CM | POA: Diagnosis not present

## 2023-12-13 DIAGNOSIS — F603 Borderline personality disorder: Secondary | ICD-10-CM | POA: Diagnosis not present

## 2023-12-13 DIAGNOSIS — F319 Bipolar disorder, unspecified: Secondary | ICD-10-CM | POA: Diagnosis not present

## 2023-12-13 DIAGNOSIS — G4709 Other insomnia: Secondary | ICD-10-CM

## 2023-12-13 DIAGNOSIS — R45851 Suicidal ideations: Secondary | ICD-10-CM

## 2023-12-13 DIAGNOSIS — F411 Generalized anxiety disorder: Secondary | ICD-10-CM

## 2023-12-13 DIAGNOSIS — F410 Panic disorder [episodic paroxysmal anxiety] without agoraphobia: Secondary | ICD-10-CM

## 2023-12-13 DIAGNOSIS — F332 Major depressive disorder, recurrent severe without psychotic features: Secondary | ICD-10-CM

## 2023-12-13 MED ORDER — LITHIUM CARBONATE 300 MG PO CAPS: 300.0000 mg | ORAL_CAPSULE | Freq: Every evening | ORAL | 2 refills | Status: DC

## 2023-12-13 MED ORDER — LAMOTRIGINE 200 MG PO TABS: 200.0000 mg | ORAL_TABLET | Freq: Every day | ORAL | 2 refills | Status: DC

## 2023-12-13 NOTE — Progress Notes (Signed)
BH MD Outpatient Progress Note  12/13/2023 10:27 AM Barbara Meyer  MRN:  [id number]  Assessment:  Barbara Meyer presents for follow-up evaluation. Today, 12/13/23, patient reports overall stability of both anxiety and depression initially but later reports that she is becoming more social based on what her sister has been telling her and not isolating as much in her room with the titration of fluoxetine.  She is still having the bilateral intention tremor of hands but reports this is not bothersome and with her polypharmacy would be difficult to determine which of her medications could be contributing to this.  Still has propranolol available as needed to assist.  For her ongoing abdominal pain without clear cause, may have surgery with gastric bypass surgeon in the future.  Overall still consistent with borderline personality disorder and suicidal ideation is down to once weekly and still no plan. On that note, usually in response to relationship injury or when hopeless about ongoing abdominal and back pain.  Sleeping about 11-12 hours a night.  Thyroid studies within normal limits. She also needs lipid panel, A1c while on Seroquel and we will plan on coming by the office to get those sets of labs or whenever she sees her PCP next.  Binge eating still roughly the same but self-harm slightly improved with not noticing the scratching sensation anymore which is an improvement.  Until suicidal ideation has been resolved for a longer period of time we will need to keep the number of pills on hand at a low amount. Follow-up in 1 month.   For safety, her acute risk factors for suicide are: Diagnosis of borderline personality disorder, current diagnosis of depression, self-harm by scratching, suicidal ideation without intent or plan.  Her chronic risk factors for suicide are: History of substance use, history of alcohol use disorder, historical diagnosis of bipolar disorder, diagnosed borderline personality  disorder, social isolation.  Her protective factors are: Employment, supportive family, beloved pets in the home, actively seeking and engaging with mental health care no suicidal ideation, no access to firearms, hope for the future.  While future events cannot be fully predicted she does not currently meet IVC criteria and can be continued as an outpatient.  Identifying Information: Barbara Meyer is a 62 y.o. 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 Initial evaluation of depression and anxiety on 07/01/23; please see that note for full case formulation.  We will plan on discontinuing lithium as next agent as at its current dose it is unlikely to be providing much benefit anyway.  Was able to get a lithium level that was drawn by gastroenterologist and found to be 0.4 but as previously discussed much lower likelihood that she has a bipolar spectrum of illness and much more likely this is a primary borderline personality disorder which would not require higher doses of lithium.   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 caffeine use and history of snoring Past medication trials:  Status of problem: Improving Interventions: -- Patient to cut back on caffeine use --Seroquel, Prozac as above   # History of vitamin D and iron deficiency with history of gastric bypass surgery Past medication trials:  Status of problem: Chronic and stable Interventions: -- Coordinate with PCP for vitamin D level and consider nutrition referral   # High risk medication use: Lithium and Lamictal Past medication trials:  Status of problem: Chronic and stable Interventions: --Lithium level on 07/24/2023 0.4, CMP within normal limits on 08/23/23 -- Thyroid panel normal as of 12/03/2023   # Long-term current use of antipsychotic: Seroquel Past medication trials:  Status of problem: Chronic and stable Interventions: -- EKG with QTc of 455 ms on 09/23/2023 --Still needs updated A1c and lipid panel  # Abdominal pain Past medication trials:  Status of problem: Chronic and stable Interventions: -- Currently followed by GI may have surgery soon   # History of tobacco use disorder in sustained remission Past medication trials:  Status of problem: In remission Interventions: -- Continue to encourage  abstinence  Patient was given contact information for behavioral health clinic and was instructed to call 911 for emergencies.   Subjective:  Chief Complaint:  Chief Complaint  Patient presents with   Borderline personality disorder   Depression   Anxiety   Follow-up   Trauma   Stress    Interval History: Doing alright this morning, holding steady, still feeling tired this morning and still thinks sleeping too much. Will get home around 1045p and stays up for a bit watching tv but then will sleep for 11-12hrs after taking medication around 1a and falls asleep within . Still feels tired until she drinks coffee but then feels fine. Doesn't feel bad to sleep that much but does think it could be related to depression. Found out she has to have surgery and the bariatric surgeon saw an abnormality on scans which hopefully will resolve the stomach pain. Still doing imodium every day. Sister told her she has been more social with coming out of her room more to speak with them. The SI hasn't been coming up as much as before either, maybe once per week more general thought and no plans. Tremor is still about the same and mostly with intention when trying to write at work or putting denture glue on. Eating still roughly the same with most eating occurring at night and when waking during the night will eat snacks. With further reflection isn't getting consistent sleep until 4a or 5a.   Visit Diagnosis:    ICD-10-CM   1. Borderline personality disorder (HCC)  F60.3 lithium carbonate 300 MG capsule    lamoTRIgine (LAMICTAL) 200 MG tablet  2. Major depressive disorder, recurrent severe without psychotic features (HCC)  F33.2 lithium carbonate 300 MG capsule    lamoTRIgine (LAMICTAL) 200 MG tablet    3. History of bipolar disorder  F31.9 lithium carbonate 300 MG capsule    lamoTRIgine (LAMICTAL) 200 MG tablet    4. PTSD (post-traumatic stress disorder)  F43.10     5. Suicidal ideation without  plan or intent  R45.851     6. Long-term use of high-risk medication: Lithium  Z79.899     7. Long term current use of antipsychotic medication  Z79.899     8. Insomnia with night caffeine use  G47.09     9. 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, history of alcohol use disorder in sustained remission, history of tobacco use disorder in sustained remission Medication trials: effexor (doesn't remember), lithium (hard to say if working), seroquel (hard to say if working), latuda (hard to say if worked), trazodone (doesn't remember), hydroxyzine (doesn't remember), lamictal (doesn't remember), propranolol, wellbutrin (felt like being on meth again and was more isolative), Prozac (effective) Previous psychiatrist/therapist: yes to both Hospitalizations: none Suicide attempts: none SIB: scratches at times on arms Hx of violence towards others: none Current access to guns: none Hx of trauma/abuse: none Substance use: No alcohol at present, has been 25 years since last drink as a recovering alcoholic since 1999. No tobacco products since in roughly 2002. No other drugs at present. Quit methamphetamine in 1999 as well; used IV and was treated for hepatitis C.   Past Medical History:  Past Medical History:  Diagnosis Date   Anxiety    Bipolar 1 disorder (HCC)    Borderline personality disorder (HCC)    Depression    PTSD (post-traumatic stress disorder)     Past Surgical History:  Procedure Laterality Date   GASTRIC BYPASS  10/2019   HERNIA REPAIR  2022   hernia repair and mass removal   WRIST  FRACTURE SURGERY Left 12/20/2020    Family Psychiatric History: son on medication for bipolar and depression, mother/father alcoholic   Family History:  Family History  Problem Relation Age of Onset   Brain cancer Brother    Rectal cancer Maternal Uncle        met to bones    Social History:  Academic/Vocational: Works at [employer]   Social History   Socioeconomic History   Marital status: Single    Spouse name: Not on file   Number of children: Not on file   Years of education: Not on file   Highest education level: Not on file  Occupational History   Not on file  Tobacco Use   Smoking status: Former    Current packs/day: 0.00    Types: Cigarettes    Quit date: 2002    Years since quitting: 22.9   Smokeless tobacco: Never  Vaping Use   Vaping status: Never Used  Substance and Sexual Activity   Alcohol use: Not Currently    Comment: Sober since 1999   Drug use: Not Currently    Types: Methamphetamines    Comment: Sober since 1999   Sexual  activity: Not Currently  Other Topics Concern   Not on file  Social History Narrative   Not on file   Social Drivers of Health   Financial Resource Strain: Low Risk  (09/20/2021)   Overall Financial Resource Strain (CARDIA)    Difficulty of Paying Living Expenses: Not hard at all  Food Insecurity: No Food Insecurity (09/20/2021)   Hunger Vital Sign    Worried About Running Out of Food in the Last Year: Never true    Ran Out of Food in the Last Year: Never true  Transportation Needs: No Transportation Needs (09/20/2021)   PRAPARE - Administrator, Civil Service (Medical): No    Lack of Transportation (Non-Medical): No  Physical Activity: Inactive (09/20/2021)   Exercise Vital Sign    Days of Exercise per Week: 0 days    Minutes of Exercise per Session: 0 min  Stress: Stress Concern Present (09/20/2021)   [employer] of Occupational Health - Occupational Stress Questionnaire    Feeling of Stress : To  some extent  Social Connections: Moderately Isolated (09/20/2021)   Social Connection and Isolation Panel [NHANES]    Frequency of Communication with Friends and Family: More than three times a week    Frequency of Social Gatherings with Friends and Family: More than three times a week    Attends Religious Services: More than 4 times per year    Active Member of [employer] or Organizations: No    Attends Banker Meetings: Never    Marital Status: Never married    Allergies:  Allergies  Allergen Reactions   Lisinopril Cough    Current Medications: Current Outpatient Medications  Medication Sig Dispense Refill   acetaminophen (TYLENOL) 500 MG tablet Take 1,500 mg by mouth 2 (two) times daily as needed for moderate pain.     cholestyramine (QUESTRAN) 4 g packet Take 4 g by mouth 3 (three) times daily.     FLUoxetine (PROZAC) 40 MG capsule Take 1 capsule (40 mg total) by mouth at bedtime. 30 capsule 2   lamoTRIgine (LAMICTAL) 200 MG tablet Take 1 tablet (200 mg total) by mouth daily. 30 tablet 2   lithium carbonate 300 MG capsule Take 1 capsule (300 mg total) by mouth at bedtime. 30 capsule 2   loperamide (IMODIUM A-D) 2 MG tablet Take 2 mg by mouth 3 (three) times daily as needed for diarrhea or loose stools.     Multiple Vitamin (MULTIVITAMIN) tablet Take 1 tablet by mouth daily.     propranolol (INDERAL) 10 MG tablet Take 1 tablet (10 mg total) by mouth 2 (two) times daily as needed (Anxiety/panic). 60 tablet 2   QUEtiapine (SEROQUEL) 200 MG tablet Take 1 tablet (200 mg total) by mouth at bedtime. 30 tablet 2   No current facility-administered medications for this visit.    ROS: Review of Systems  Constitutional:  Positive for appetite change and unexpected weight change.  HENT:         Dental implants  Gastrointestinal:  Positive for abdominal pain and diarrhea. Negative for constipation, nausea and vomiting.  Endocrine: Positive for cold intolerance, heat intolerance  and polyphagia.  Musculoskeletal:  Positive for back pain. Negative for arthralgias.  Skin:        No hair loss  Neurological:  Positive for tremors and headaches. Negative for dizziness.  Psychiatric/Behavioral:  Positive for decreased concentration, dysphoric mood, self-injury, sleep disturbance and suicidal ideas. Negative for hallucinations. The patient is nervous/anxious.  Objective:  Psychiatric Specialty Exam: There were no vitals taken for this visit.There is no height or weight on file to calculate BMI.  General Appearance: Casual, Fairly Groomed, and appears stated age  Eye Contact:  Fair  Speech:  Normal Rate and slight impairment to articulation with dental implants  Volume:  Normal  Mood:   "Alright, about the same.  But my sister says I am coming out more"  Affect:  Appropriate, Congruent, Depressed, and significantly less anxious  Thought Content: Logical and Hallucinations: Tactile   Suicidal Thoughts:  None in session today but still intermittent and passive when occurring usually in response to relationship injury or thinking about ongoing abdominal pain; down to once weekly  Homicidal Thoughts:  No  Thought Process:  Coherent, Goal Directed, and Linear  Orientation:  Full (Time, Place, and Person)    Memory:  Grossly intact   Judgment:  Fair  Insight:  Fair  Concentration:  Concentration: Fair  Recall:  not formally assessed   Fund of Knowledge: Fair  Language: Fair  Psychomotor Activity:  Normal  Akathisia:  No  AIMS (if indicated): Done, 0 as tremor of bilateral hands not included  Assets:  Communication Skills Desire for Improvement Financial Resources/Insurance Housing Leisure Time Resilience Social Support Talents/Skills Transportation Vocational/Educational  ADL's:  Intact  Cognition: WNL  Sleep:  Fair   PE: General: sits comfortably in view of camera; no acute distress Pulm: no increased work of breathing on room air  MSK: all extremity  movements appear intact  Neuro: no focal neurological deficits observed  Gait & Station: unable to assess by video    Metabolic Disorder Labs: No results found for: "HGBA1C", "MPG" No results found for: "PROLACTIN" No results found for: "CHOL", "TRIG", "HDL", "CHOLHDL", "VLDL", "LDLCALC" Lab Results  Component Value Date   TSH 1.450 12/03/2023    Therapeutic Level Labs: No results found for: "LITHIUM" No results found for: "VALPROATE" No results found for: "CBMZ"  Screenings:  PHQ2-9    Flowsheet Row Office Visit from 07/01/2023 in [facility] [place] Office Visit from 09/20/2021 in [facility]  PHQ-2 Total Score 6 2  PHQ-9 Total Score 18 6      Flowsheet Row ED from 11/01/2023 in [facility] Department at [facility] ED from 09/21/2023 in [facility] Department at [facility] ED from 08/23/2023 in [facility] Emergency Department at [facility]  C-SSRS RISK CATEGORY No Risk No Risk No Risk       Collaboration of Care: Collaboration of Care: Medication Management AEB as above and Primary Care Provider AEB as above  Patient/Guardian was advised Release of Information must be obtained prior to any record release in order to collaborate their care with an outside provider. Patient/Guardian was advised if they have not already done so to contact the registration department to sign all necessary forms in order for [place] to release information regarding their care.   Consent: Patient/Guardian gives verbal consent for treatment and assignment of benefits for services provided during this visit. Patient/Guardian expressed understanding and agreed to proceed.   Televisit via video: I connected with patient on 12/13/23 at 10:00 AM EST by a video enabled telemedicine application and verified that I am speaking with the correct person using two identifiers.  Location: Patient: At  work at the [employer]: remote office in [place]   I discussed the limitations of evaluation and management  by telemedicine and the availability of in person appointments. The patient expressed understanding and agreed to proceed.  I discussed the assessment and treatment plan with the patient. The patient was provided an opportunity to ask questions and all were answered. The patient agreed with the plan and demonstrated an understanding of the instructions.   The patient was advised to call back or seek an in-person evaluation if the symptoms worsen or if the condition fails to improve as anticipated.  I provided 20 minutes dedicated to the care of this patient via video on the date of this encounter to include chart review, face-to-face time with the patient, medication management/counseling, coordination of care with PCP.  Elsie Lincoln, MD 12/13/2023, 10:27 AM

## 2023-12-13 NOTE — Patient Instructions (Signed)
We did not make any medication changes today.  Keep up the good work with trying to engage more with people you trust.  Whenever you see your PCP next we still want to get an updated lipid panel and A1c but these are not urgent.  We can also likely wait until you see your surgeon for an updated EKG.

## 2023-12-15 ENCOUNTER — Ambulatory Visit: Payer: Self-pay | Admitting: Surgery

## 2023-12-22 NOTE — Patient Instructions (Signed)
SURGICAL WAITING ROOM VISITATION Patients having surgery or a procedure may have no more than 2 support people in the waiting area - these visitors may rotate in the visitor waiting room.   Due to an increase in RSV and influenza rates and associated hospitalizations, children ages 47 and under may not visit patients in [facility] hospitals. If the patient needs to stay at the hospital during part of their recovery, the visitor guidelines for inpatient rooms apply.  PRE-OP VISITATION  Pre-op nurse will coordinate an appropriate time for 1 support person to accompany the patient in pre-op.  This support person may not rotate.  This visitor will be contacted when the time is appropriate for the visitor to come back in the pre-op area.  Please refer to the [facility] website for the visitor guidelines for Inpatients (after your surgery is over and you are in a regular room).  You are not required to quarantine at this time prior to your surgery. However, you must do this: Hand Hygiene often Do NOT share personal items Notify your provider if you are in close contact with someone who has COVID or you develop fever 100.4 or greater, new onset of sneezing, cough, sore throat, shortness of breath or body aches.  If you test positive for Covid or have been in contact with anyone that has tested positive in the last 10 days please notify you surgeon.    Your procedure is scheduled on:  MONDAY   January 06, 2024  Report to [facility] Main Entrance: Leota Jacobsen entrance where the [employer] is available.   Report to admitting at:  1:15 PM  Call this number if you have any questions or problems the morning of surgery [phone]  FOLLOW ANY ADDITIONAL PRE OP INSTRUCTIONS YOU RECEIVED FROM YOUR SURGEON'S OFFICE!!!  Have a clear liquid diet the day before your surgery  Do not eat food after Midnight the night prior to your surgery/procedure.  After Midnight you may have the following  liquids until  12:30  PM DAY OF SURGERY  Clear Liquid Diet Water Black Coffee (sugar ok, NO MILK/CREAM OR CREAMERS)  Tea (sugar ok, NO MILK/CREAM OR CREAMERS) regular and decaf                             Plain Jell-O  with no fruit (NO RED)                                           Fruit ices (not with fruit pulp, NO RED)                                     Popsicles (NO RED)                                                                  Juice: NO CITRUS JUICES: only apple, WHITE grape, WHITE cranberry Sports drinks like Gatorade or Powerade (NO RED)  Oral Hygiene is also important to reduce your risk of infection.        Remember - BRUSH YOUR TEETH THE MORNING OF SURGERY WITH YOUR REGULAR TOOTHPASTE  Do NOT smoke after Midnight the night before surgery.  STOP TAKING all Vitamins, Herbs and supplements 1 week before your surgery.   Take ONLY these medicines the morning of surgery with A SIP OF WATER: Lamotirigne (Lamictal), propranolol.  You may take Tylenol if needed for pain                    You may not have any metal on your body including hair pins, jewelry, and body piercing  Do not wear make-up, lotions, powders, perfumes or deodorant  Do not wear nail polish including gel and S&S, artificial / acrylic nails, or any other type of covering on natural nails including finger and toenails. If you have artificial nails, gel coating, etc., that needs to be removed by a nail salon, Please have this removed prior to surgery. Not doing so may mean that your surgery could be cancelled or delayed if the Surgeon or anesthesia staff feels like they are unable to monitor you safely.   Do not shave 48 hours prior to surgery to avoid nicks in your skin which may contribute to postoperative infections.    Contacts, Hearing Aids, dentures or bridgework may not be worn into surgery. DENTURES WILL BE REMOVED PRIOR TO SURGERY PLEASE DO NOT APPLY "Poly grip" OR  ADHESIVES!!!   Patients discharged on the day of surgery will not be allowed to drive home.  Someone NEEDS to stay with you for the first 24 hours after anesthesia.  Do not bring your home medications to the hospital. The Pharmacy will dispense medications listed on your medication list to you during your admission in the Hospital.  Please read over the following fact sheets you were given: IF YOU HAVE QUESTIONS ABOUT YOUR PRE-OP INSTRUCTIONS, PLEASE CALL [phone]   [facility] - Preparing for Surgery Before surgery, you can play an important role.  Because skin is not sterile, your skin needs to be as free of germs as possible.  You can reduce the number of germs on your skin by washing with CHG (chlorahexidine gluconate) soap before surgery.  CHG is an antiseptic cleaner which kills germs and bonds with the skin to continue killing germs even after washing. Please DO NOT use if you have an allergy to CHG or antibacterial soaps.  If your skin becomes reddened/irritated stop using the CHG and inform your nurse when you arrive at Short Stay. Do not shave (including legs and underarms) for at least 48 hours prior to the first CHG shower.  You may shave your face/neck.  Please follow these instructions carefully:  1.  Shower with CHG Soap the night before surgery and the  morning of surgery.  2.  If you choose to wash your hair, wash your hair first as usual with your normal  shampoo.  3.  After you shampoo, rinse your hair and body thoroughly to remove the shampoo.                             4.  Use CHG as you would any other liquid soap.  You can apply chg directly to the skin and wash.  Gently with a scrungie or clean washcloth.  5.  Apply the CHG Soap to your body ONLY FROM  THE NECK DOWN.   Do not use on face/ open                           Wound or open sores. Avoid contact with eyes, ears mouth and genitals (private parts).                       Wash face,  Genitals (private parts) with  your normal soap.             6.  Wash thoroughly, paying special attention to the area where your  surgery  will be performed.  7.  Thoroughly rinse your body with warm water from the neck down.  8.  DO NOT shower/wash with your normal soap after using and rinsing off the CHG Soap.            9.  Pat yourself dry with a clean towel.            10.  Wear clean pajamas.            11.  Place clean sheets on your bed the night of your first shower and do not  sleep with pets.  ON THE DAY OF SURGERY : Do not apply any lotions/deodorants the morning of surgery.  Please wear clean clothes to the hospital/surgery center.     FAILURE TO FOLLOW THESE INSTRUCTIONS MAY RESULT IN THE CANCELLATION OF YOUR SURGERY  PATIENT SIGNATURE_________________________________  NURSE SIGNATURE__________________________________  ________________________________________________________________________

## 2023-12-22 NOTE — Progress Notes (Addendum)
 COVID Vaccine received:  []  No [x]  Yes Date of any COVID positive Test in last 90 days:  none  PCP - Worth Moloney, PA at [facility] -  Oncology- Alean Stands, MD   Chest x-ray - 08-15-2022  2v  Epic EKG -  09-23-2023  Epic Stress Test -  ECHO -  Cardiac Cath -   PCR screen: []  Ordered & Completed []   No Order but Needs PROFEND     [x]   N/A for this surgery  Surgery Plan:  [x]  Ambulatory   []  Outpatient in bed  []  Admit Anesthesia:    [x]  General  []  Spinal  []   Choice []   MAC  Bowel Prep - [x]  No  []   Yes __clear liquids day before. ____  Pacemaker / ICD device [x]  No []  Yes   Spinal Cord Stimulator:[x]  No []  Yes       History of Sleep Apnea? [x]  No []  Yes   CPAP used?- [x]  No []  Yes    Does the patient monitor blood sugar?   [x]  N/A   []  No []  Yes  Patient has: [x]  NO Hx DM   []  Pre-DM   []  DM1  []   DM2  Blood Thinner / Instructions: none Aspirin Instructions:  none  ERAS Protocol Ordered: []  No  [x]  Yes PRE-SURGERY []  ENSURE  []  G2   [x]  No Drink Ordered Patient is to be NPO after: 1230  Dental hx: [x]  Dentures: full set of dentures []  N/A      []  Bridge or Partial:                   []  Loose or Damaged teeth:  Patient and I discussed at length that she would not be able to wear her dentures into surgery. I also educated her that she would have to remove any adhesives from her gums.  Patient reluctantly voiced agreement and understanding of the reasons why she could not wear her dentures.    Activity level: Patient is able to climb a flight of stairs without difficulty; [x]  No CP   but would have _some SOB.  Patient can perform ADLs without assistance.   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  Has tremors from psych meds- High risk medications  Patient denies shortness of  breath, fever, cough and chest pain at PAT appointment.  Patient verbalized understanding and agreement to the Pre-Surgical Instructions that were given to them at this PAT appointment. Patient was also educated of the need to review these PAT instructions again prior to his/her surgery.I reviewed the appropriate phone numbers to call if they have any and questions or concerns.

## 2023-12-24 ENCOUNTER — Encounter (HOSPITAL_COMMUNITY): Payer: Self-pay

## 2023-12-24 ENCOUNTER — Other Ambulatory Visit: Payer: Self-pay

## 2023-12-24 ENCOUNTER — Encounter (HOSPITAL_COMMUNITY)
Admission: RE | Admit: 2023-12-24 | Discharge: 2023-12-24 | Disposition: A | Discharge status: Home / Self Care | Payer: Medicaid Other | Source: Ambulatory Visit | Attending: Surgery | Admitting: Surgery

## 2023-12-24 VITALS — BP 139/74 | HR 56 | Temp 98.5°F | Resp 14 | Ht 66.0 in | Wt 188.0 lb

## 2023-12-24 DIAGNOSIS — I10 Essential (primary) hypertension: Secondary | ICD-10-CM | POA: Insufficient documentation

## 2023-12-24 DIAGNOSIS — Z01812 Encounter for preprocedural laboratory examination: Secondary | ICD-10-CM | POA: Diagnosis not present

## 2023-12-24 DIAGNOSIS — Z79899 Other long term (current) drug therapy: Secondary | ICD-10-CM | POA: Insufficient documentation

## 2023-12-24 DIAGNOSIS — Z01818 Encounter for other preprocedural examination: Secondary | ICD-10-CM

## 2023-12-24 HISTORY — DX: Anemia, unspecified: D64.9

## 2023-12-24 HISTORY — DX: Unspecified cirrhosis of liver: K74.60

## 2023-12-24 HISTORY — DX: Inflammatory liver disease, unspecified: K75.9

## 2023-12-24 HISTORY — DX: Essential (primary) hypertension: I10

## 2023-12-24 HISTORY — DX: Unspecified osteoarthritis, unspecified site: M19.90

## 2023-12-24 HISTORY — DX: Myoneural disorder, unspecified: G70.9

## 2023-12-24 LAB — CBC
HCT: 37.3 % (ref 36.0–46.0)
Hemoglobin: 11.2 g/dL — ABNORMAL LOW (ref 12.0–15.0)
MCH: 27.3 pg (ref 26.0–34.0)
MCHC: 30 g/dL (ref 30.0–36.0)
MCV: 91 fL (ref 80.0–100.0)
Platelets: 222 10*3/uL (ref 150–400)
RBC: 4.1 MIL/uL (ref 3.87–5.11)
RDW: 14.7 % (ref 11.5–15.5)
WBC: 4.4 10*3/uL (ref 4.0–10.5)
nRBC: 0 % (ref 0.0–0.2)

## 2023-12-24 LAB — COMPREHENSIVE METABOLIC PANEL
ALT: 15 U/L (ref 0–44)
AST: 22 U/L (ref 15–41)
Albumin: 3.5 g/dL (ref 3.5–5.0)
Alkaline Phosphatase: 83 U/L (ref 38–126)
Anion gap: 9 (ref 5–15)
BUN: 20 mg/dL (ref 8–23)
CO2: 22 mmol/L (ref 22–32)
Calcium: 8.8 mg/dL — ABNORMAL LOW (ref 8.9–10.3)
Chloride: 107 mmol/L (ref 98–111)
Creatinine, Ser: 0.53 mg/dL (ref 0.44–1.00)
GFR, Estimated: >60 mL/min (ref >60)
Glucose, Bld: 95 mg/dL (ref 70–99)
Potassium: 4.1 mmol/L (ref 3.5–5.1)
Sodium: 138 mmol/L (ref 135–145)
Total Bilirubin: 0.3 mg/dL (ref 0.0–1.2)
Total Protein: 6.6 g/dL (ref 6.5–8.1)

## 2023-12-25 ENCOUNTER — Other Ambulatory Visit (HOSPITAL_COMMUNITY): Payer: Self-pay | Admitting: Psychiatry

## 2023-12-25 DIAGNOSIS — F319 Bipolar disorder, unspecified: Secondary | ICD-10-CM

## 2023-12-25 DIAGNOSIS — F410 Panic disorder [episodic paroxysmal anxiety] without agoraphobia: Secondary | ICD-10-CM

## 2023-12-25 DIAGNOSIS — F603 Borderline personality disorder: Secondary | ICD-10-CM

## 2023-12-31 ENCOUNTER — Other Ambulatory Visit (HOSPITAL_COMMUNITY): Payer: Self-pay | Admitting: Psychiatry

## 2023-12-31 DIAGNOSIS — F410 Panic disorder [episodic paroxysmal anxiety] without agoraphobia: Secondary | ICD-10-CM

## 2024-01-06 ENCOUNTER — Ambulatory Visit (HOSPITAL_BASED_OUTPATIENT_CLINIC_OR_DEPARTMENT_OTHER): Payer: Medicaid Other | Admitting: Physician Assistant

## 2024-01-06 ENCOUNTER — Encounter (HOSPITAL_COMMUNITY): Payer: Self-pay | Admitting: Surgery

## 2024-01-06 ENCOUNTER — Encounter (HOSPITAL_COMMUNITY)
Admission: RE | Disposition: A | Discharge status: Home / Self Care | Payer: Self-pay | Source: Home / Self Care | Attending: Surgery

## 2024-01-06 ENCOUNTER — Ambulatory Visit (HOSPITAL_COMMUNITY)
Admission: RE | Admit: 2024-01-06 | Discharge: 2024-01-06 | Disposition: A | Discharge status: Home / Self Care | Payer: Medicaid Other | Attending: Surgery | Admitting: Surgery

## 2024-01-06 ENCOUNTER — Ambulatory Visit (HOSPITAL_COMMUNITY): Payer: Medicaid Other | Admitting: Physician Assistant

## 2024-01-06 ENCOUNTER — Other Ambulatory Visit: Payer: Self-pay

## 2024-01-06 DIAGNOSIS — Z85048 Personal history of other malignant neoplasm of rectum, rectosigmoid junction, and anus: Secondary | ICD-10-CM | POA: Insufficient documentation

## 2024-01-06 DIAGNOSIS — Z9884 Bariatric surgery status: Secondary | ICD-10-CM | POA: Diagnosis not present

## 2024-01-06 DIAGNOSIS — I10 Essential (primary) hypertension: Secondary | ICD-10-CM | POA: Insufficient documentation

## 2024-01-06 DIAGNOSIS — M1990 Unspecified osteoarthritis, unspecified site: Secondary | ICD-10-CM | POA: Diagnosis not present

## 2024-01-06 DIAGNOSIS — K9589 Other complications of other bariatric procedure: Secondary | ICD-10-CM

## 2024-01-06 DIAGNOSIS — G251 Drug-induced tremor: Secondary | ICD-10-CM | POA: Insufficient documentation

## 2024-01-06 DIAGNOSIS — F319 Bipolar disorder, unspecified: Secondary | ICD-10-CM | POA: Insufficient documentation

## 2024-01-06 DIAGNOSIS — F418 Other specified anxiety disorders: Secondary | ICD-10-CM | POA: Insufficient documentation

## 2024-01-06 DIAGNOSIS — Z8619 Personal history of other infectious and parasitic diseases: Secondary | ICD-10-CM | POA: Insufficient documentation

## 2024-01-06 DIAGNOSIS — Z87891 Personal history of nicotine dependence: Secondary | ICD-10-CM | POA: Diagnosis not present

## 2024-01-06 DIAGNOSIS — K7460 Unspecified cirrhosis of liver: Secondary | ICD-10-CM | POA: Insufficient documentation

## 2024-01-06 DIAGNOSIS — Y832 Surgical operation with anastomosis, bypass or graft as the cause of abnormal reaction of the patient, or of later complication, without mention of misadventure at the time of the procedure: Secondary | ICD-10-CM | POA: Diagnosis not present

## 2024-01-06 DIAGNOSIS — R109 Unspecified abdominal pain: Secondary | ICD-10-CM | POA: Insufficient documentation

## 2024-01-06 DIAGNOSIS — Z923 Personal history of irradiation: Secondary | ICD-10-CM | POA: Insufficient documentation

## 2024-01-06 DIAGNOSIS — Z9221 Personal history of antineoplastic chemotherapy: Secondary | ICD-10-CM | POA: Insufficient documentation

## 2024-01-06 HISTORY — PX: UPPER GI ENDOSCOPY: SHX6162

## 2024-01-06 HISTORY — PX: LAPAROSCOPY: SHX197

## 2024-01-06 SURGERY — LAPAROSCOPY, DIAGNOSTIC
Anesthesia: General

## 2024-01-06 MED ORDER — CEFAZOLIN SODIUM-DEXTROSE 2-4 GM/100ML-% IV SOLN
2.0000 g | INTRAVENOUS | Status: AC
  Administered 2024-01-06: 2 g via INTRAVENOUS
  Filled 2024-01-06: qty 100

## 2024-01-06 MED ORDER — ONDANSETRON HCL 4 MG/2ML IJ SOLN
INTRAMUSCULAR | Status: AC
  Filled 2024-01-06: qty 2

## 2024-01-06 MED ORDER — EPINEPHRINE 1 MG/10ML IJ SOSY
PREFILLED_SYRINGE | INTRAMUSCULAR | Status: AC
  Filled 2024-01-06: qty 10

## 2024-01-06 MED ORDER — CHLORHEXIDINE GLUCONATE 0.12 % MT SOLN
15.0000 mL | Freq: Once | OROMUCOSAL | Status: AC
  Administered 2024-01-06: 15 mL via OROMUCOSAL

## 2024-01-06 MED ORDER — ROCURONIUM BROMIDE 100 MG/10ML IV SOLN
INTRAVENOUS | Status: DC | PRN
  Administered 2024-01-06: 50 mg via INTRAVENOUS

## 2024-01-06 MED ORDER — PHENYLEPHRINE HCL (PRESSORS) 10 MG/ML IV SOLN
INTRAVENOUS | Status: AC
  Filled 2024-01-06: qty 1

## 2024-01-06 MED ORDER — DEXAMETHASONE SODIUM PHOSPHATE 10 MG/ML IJ SOLN
INTRAMUSCULAR | Status: AC
  Filled 2024-01-06: qty 1

## 2024-01-06 MED ORDER — PANTOPRAZOLE SODIUM 40 MG PO TBEC: 40.0000 mg | DELAYED_RELEASE_TABLET | Freq: Every day | ORAL | 0 refills | Status: DC

## 2024-01-06 MED ORDER — MIDAZOLAM HCL 5 MG/5ML IJ SOLN
INTRAMUSCULAR | Status: DC | PRN
  Administered 2024-01-06: 2 mg via INTRAVENOUS

## 2024-01-06 MED ORDER — FENTANYL CITRATE (PF) 100 MCG/2ML IJ SOLN
INTRAMUSCULAR | Status: AC
  Filled 2024-01-06: qty 2

## 2024-01-06 MED ORDER — OXYCODONE HCL 5 MG PO TABS
ORAL_TABLET | ORAL | Status: AC
  Filled 2024-01-06: qty 1

## 2024-01-06 MED ORDER — PROPOFOL 1000 MG/100ML IV EMUL
INTRAVENOUS | Status: AC
  Filled 2024-01-06: qty 100

## 2024-01-06 MED ORDER — ACETAMINOPHEN 500 MG PO TABS
1000.0000 mg | ORAL_TABLET | ORAL | Status: AC
  Administered 2024-01-06: 1000 mg via ORAL
  Filled 2024-01-06: qty 2

## 2024-01-06 MED ORDER — PROPOFOL 10 MG/ML IV BOLUS
INTRAVENOUS | Status: DC | PRN
  Administered 2024-01-06: 150 mg via INTRAVENOUS

## 2024-01-06 MED ORDER — SUGAMMADEX SODIUM 200 MG/2ML IV SOLN
INTRAVENOUS | Status: DC | PRN
  Administered 2024-01-06 (×2): 200 mg via INTRAVENOUS

## 2024-01-06 MED ORDER — CHLORHEXIDINE GLUCONATE CLOTH 2 % EX PADS: 6.0000 | MEDICATED_PAD | Freq: Once | CUTANEOUS | Status: DC

## 2024-01-06 MED ORDER — OXYCODONE-ACETAMINOPHEN 5-325 MG PO TABS: 1.0000 | ORAL_TABLET | ORAL | 0 refills | Status: DC | PRN

## 2024-01-06 MED ORDER — PHENYLEPHRINE HCL (PRESSORS) 10 MG/ML IV SOLN
INTRAVENOUS | Status: DC | PRN
  Administered 2024-01-06 (×4): 160 ug via INTRAVENOUS
  Administered 2024-01-06: 80 ug via INTRAVENOUS
  Administered 2024-01-06: 160 ug via INTRAVENOUS

## 2024-01-06 MED ORDER — OXYCODONE HCL 5 MG PO TABS
5.0000 mg | ORAL_TABLET | Freq: Once | ORAL | Status: AC | PRN
  Administered 2024-01-06: 5 mg via ORAL

## 2024-01-06 MED ORDER — GLYCOPYRROLATE PF 0.2 MG/ML IJ SOSY
PREFILLED_SYRINGE | INTRAMUSCULAR | Status: DC | PRN
  Administered 2024-01-06: .1 mg via INTRAVENOUS

## 2024-01-06 MED ORDER — BUPIVACAINE LIPOSOME 1.3 % IJ SUSP: 20.0000 mL | Freq: Once | INTRAMUSCULAR | Status: DC

## 2024-01-06 MED ORDER — KETAMINE HCL 50 MG/5ML IJ SOSY
PREFILLED_SYRINGE | INTRAMUSCULAR | Status: AC
  Filled 2024-01-06: qty 5

## 2024-01-06 MED ORDER — ENOXAPARIN SODIUM 40 MG/0.4ML IJ SOSY
40.0000 mg | PREFILLED_SYRINGE | Freq: Once | INTRAMUSCULAR | Status: AC
  Administered 2024-01-06: 40 mg via SUBCUTANEOUS
  Filled 2024-01-06: qty 0.4

## 2024-01-06 MED ORDER — BUPIVACAINE-EPINEPHRINE 0.25% -1:200000 IJ SOLN
INTRAMUSCULAR | Status: AC
Start: 2024-01-06 — End: ?
  Filled 2024-01-06: qty 1

## 2024-01-06 MED ORDER — OXYCODONE HCL 5 MG/5ML PO SOLN: 5.0000 mg | Freq: Once | ORAL | Status: AC | PRN

## 2024-01-06 MED ORDER — MIDAZOLAM HCL 2 MG/2ML IJ SOLN
INTRAMUSCULAR | Status: AC
  Filled 2024-01-06: qty 2

## 2024-01-06 MED ORDER — EPHEDRINE SULFATE (PRESSORS) 50 MG/ML IJ SOLN
INTRAMUSCULAR | Status: DC | PRN
  Administered 2024-01-06 (×2): 10 mg via INTRAVENOUS
  Administered 2024-01-06: 5 mg via INTRAVENOUS
  Administered 2024-01-06 (×2): 10 mg via INTRAVENOUS

## 2024-01-06 MED ORDER — FENTANYL CITRATE PF 50 MCG/ML IJ SOSY: 25.0000 ug | PREFILLED_SYRINGE | INTRAMUSCULAR | Status: DC | PRN

## 2024-01-06 MED ORDER — BUPIVACAINE-EPINEPHRINE 0.25% -1:200000 IJ SOLN
INTRAMUSCULAR | Status: DC | PRN
  Administered 2024-01-06: 20 mL

## 2024-01-06 MED ORDER — DEXAMETHASONE SODIUM PHOSPHATE 4 MG/ML IJ SOLN
INTRAMUSCULAR | Status: DC | PRN
  Administered 2024-01-06: 4 mg via INTRAVENOUS

## 2024-01-06 MED ORDER — ONDANSETRON HCL 4 MG/2ML IJ SOLN
INTRAMUSCULAR | Status: DC | PRN
  Administered 2024-01-06: 4 mg via INTRAVENOUS

## 2024-01-06 MED ORDER — PHENYLEPHRINE 80 MCG/ML (10ML) SYRINGE FOR IV PUSH (FOR BLOOD PRESSURE SUPPORT)
PREFILLED_SYRINGE | INTRAVENOUS | Status: AC
  Filled 2024-01-06: qty 10

## 2024-01-06 MED ORDER — GABAPENTIN 300 MG PO CAPS
300.0000 mg | ORAL_CAPSULE | ORAL | Status: AC
  Administered 2024-01-06: 300 mg via ORAL
  Filled 2024-01-06: qty 1

## 2024-01-06 MED ORDER — LACTATED RINGERS IV SOLN: INTRAVENOUS | Status: DC

## 2024-01-06 MED ORDER — SUCRALFATE 1 G PO TABS: 1.0000 g | ORAL_TABLET | Freq: Two times a day (BID) | ORAL | 3 refills | Status: DC

## 2024-01-06 MED ORDER — FENTANYL CITRATE (PF) 100 MCG/2ML IJ SOLN
INTRAMUSCULAR | Status: DC | PRN
  Administered 2024-01-06 (×2): 50 ug via INTRAVENOUS

## 2024-01-06 MED ORDER — LIDOCAINE HCL (CARDIAC) PF 100 MG/5ML IV SOSY
PREFILLED_SYRINGE | INTRAVENOUS | Status: DC | PRN
  Administered 2024-01-06: 100 mg via INTRAVENOUS

## 2024-01-06 MED ORDER — AMISULPRIDE (ANTIEMETIC) 5 MG/2ML IV SOLN
10.0000 mg | Freq: Once | INTRAVENOUS | Status: DC | PRN
Start: 2024-01-06 — End: 2024-01-06

## 2024-01-06 MED ORDER — ORAL CARE MOUTH RINSE: 15.0000 mL | Freq: Once | OROMUCOSAL | Status: AC

## 2024-01-06 SURGICAL SUPPLY — 50 items
APPLIER CLIP 5 13 M/L LIGAMAX5 (MISCELLANEOUS)
APPLIER CLIP ROT 10 11.4 M/L (STAPLE)
BAG COUNTER SPONGE SURGICOUNT (BAG) IMPLANT
BLADE EXTENDED COATED 6.5IN (ELECTRODE) IMPLANT
BLADE SURG SZ10 CARB STEEL (BLADE) IMPLANT
CHLORAPREP W/TINT 26 (MISCELLANEOUS) ×1 IMPLANT
CLIP APPLIE 5 13 M/L LIGAMAX5 (MISCELLANEOUS) IMPLANT
CLIP APPLIE ROT 10 11.4 M/L (STAPLE) IMPLANT
COVER MAYO STAND STRL (DRAPES) IMPLANT
COVER SURGICAL LIGHT HANDLE (MISCELLANEOUS) ×1 IMPLANT
DERMABOND ADVANCED .7 DNX12 (GAUZE/BANDAGES/DRESSINGS) IMPLANT
DRAPE SHEET LG 3/4 BI-LAMINATE (DRAPES) IMPLANT
DRAPE WARM FLUID 44X44 (DRAPES) IMPLANT
ELECT REM PT RETURN 15FT ADLT (MISCELLANEOUS) ×1 IMPLANT
GAUZE SPONGE 4X4 12PLY STRL (GAUZE/BANDAGES/DRESSINGS) ×1 IMPLANT
GLOVE BIO SURGEON STRL SZ7.5 (GLOVE) ×1 IMPLANT
GLOVE INDICATOR 8.0 STRL GRN (GLOVE) ×1 IMPLANT
GLOVE PI ORTHO PRO STRL 7.5 (GLOVE) ×1 IMPLANT
GOWN STRL REUS W/ TWL XL LVL3 (GOWN DISPOSABLE) ×4 IMPLANT
HANDLE SUCTION POOLE (INSTRUMENTS) IMPLANT
IRRIG SUCT STRYKERFLOW 2 WTIP (MISCELLANEOUS)
IRRIGATION SUCT STRKRFLW 2 WTP (MISCELLANEOUS) IMPLANT
KIT BASIN OR (CUSTOM PROCEDURE TRAY) ×1 IMPLANT
KIT TURNOVER KIT A (KITS) IMPLANT
LEGGING LITHOTOMY PAIR STRL (DRAPES) IMPLANT
RETRACTOR WND ALEXIS 18 MED (MISCELLANEOUS) IMPLANT
RTRCTR WOUND ALEXIS 18CM MED (MISCELLANEOUS)
SCISSORS LAP 5X35 DISP (ENDOMECHANICALS) ×1 IMPLANT
SHEARS HARMONIC 36 ACE (MISCELLANEOUS) IMPLANT
SLEEVE Z-THREAD 5X100MM (TROCAR) ×1 IMPLANT
SPIKE FLUID TRANSFER (MISCELLANEOUS) ×1 IMPLANT
STAPLER SKIN PROX WIDE 3.9 (STAPLE) IMPLANT
STRIP CLOSURE SKIN 1/2X4 (GAUZE/BANDAGES/DRESSINGS) IMPLANT
SUCTION POOLE HANDLE (INSTRUMENTS)
SUT PDS AB 1 TP1 96 (SUTURE) IMPLANT
SUT PROLENE 2 0 KS (SUTURE) IMPLANT
SUT PROLENE 2 0 SH DA (SUTURE) IMPLANT
SUT SILK 2 0 SH CR/8 (SUTURE) IMPLANT
SUT SILK 2-0 18XBRD TIE 12 (SUTURE) IMPLANT
SUT SILK 3 0 SH CR/8 (SUTURE) IMPLANT
SUT SILK 3-0 18XBRD TIE 12 (SUTURE) IMPLANT
SYR BULB IRRIG 60ML STRL (SYRINGE) IMPLANT
SYS LAPSCP GELPORT 120MM (MISCELLANEOUS)
SYSTEM LAPSCP GELPORT 120MM (MISCELLANEOUS) IMPLANT
TOWEL OR 17X26 10 PK STRL BLUE (TOWEL DISPOSABLE) ×1 IMPLANT
TRAY FOLEY MTR SLVR 16FR STAT (SET/KITS/TRAYS/PACK) ×1 IMPLANT
TRAY LAPAROSCOPIC (CUSTOM PROCEDURE TRAY) ×1 IMPLANT
TROCAR 11X100 Z THREAD (TROCAR) IMPLANT
TROCAR Z-THREAD OPTICAL 5X100M (TROCAR) ×1 IMPLANT
YANKAUER SUCT BULB TIP NO VENT (SUCTIONS) IMPLANT

## 2024-01-06 NOTE — H&P (Signed)
 Admitting Physician: Deward PARAS Keenen Roessner  Service: Bariatric Surgery  CC: Abdominal pain  Subjective   HPI: Barbara Meyer is an 63 y.o. female who is here for evaluation of abdominal pain after gastric bypass.  Past Medical History:  Diagnosis Date   Anemia    Anxiety    Arthritis    Bipolar 1 disorder (HCC)    Borderline personality disorder (HCC)    Cancer (HCC) 2021   Anal cancer   had Chemo/ Radiation in [place]    Depression    Hepatitis    HEP C, treated in past   Hypertension    Liver cirrhosis (HCC)    Neuromuscular disorder (HCC)    tremors from psych meds.   PTSD (post-traumatic stress disorder)     Past Surgical History:  Procedure Laterality Date   CHOLECYSTECTOMY     Laparoscopic   DILATION AND CURETTAGE OF UTERUS     EYE SURGERY Bilateral    cataract removal   GASTRIC BYPASS  10/2019   Roux-n-Y  done in [place]    HERNIA REPAIR  2022   hernia repair and mass removal   SKIN SURGERY     had large abscess on left shoulder blade that was drained and debrided   VAGINAL HYSTERECTOMY     WRIST FRACTURE SURGERY Left 12/20/2020    Family History  Problem Relation Age of Onset   Brain cancer Brother    Rectal cancer Maternal Uncle        met to bones    Social:  reports that she quit smoking about 23 years ago. Her smoking use included cigarettes. She has never used smokeless tobacco. She reports that she does not currently use alcohol. She reports that she does not currently use drugs after having used the following drugs: Methamphetamines.  Allergies:  Allergies  Allergen Reactions   Lisinopril Cough    Medications: Current Outpatient Medications  Medication Instructions   acetaminophen  (TYLENOL ) 1,500 mg, Oral, 2 times daily PRN   FLUoxetine  (PROZAC ) 40 mg, Oral, Daily at bedtime   lamoTRIgine  (LAMICTAL ) 200 mg, Oral, Daily   lithium  carbonate 300 mg, Oral, Nightly   loperamide (IMODIUM A-D) 2 mg, Oral, 3 times daily PRN   Multiple  Vitamin (MULTIVITAMIN) tablet 1 tablet, Daily   propranolol  (INDERAL ) 10 mg, Oral, 2 times daily PRN   QUEtiapine  (SEROQUEL ) 200 mg, Oral, Daily at bedtime    ROS - all of the below systems have been reviewed with the patient and positives are indicated with bold text General: chills, fever or night sweats Eyes: blurry vision or double vision ENT: epistaxis or sore throat Allergy/Immunology: itchy/watery eyes or nasal congestion Hematologic/Lymphatic: bleeding problems, blood clots or swollen lymph nodes Endocrine: temperature intolerance or unexpected weight changes Breast: new or changing breast lumps or nipple discharge Resp: cough, shortness of breath, or wheezing CV: chest pain or dyspnea on exertion GI: as per HPI GU: dysuria, trouble voiding, or hematuria MSK: joint pain or joint stiffness Neuro: TIA or stroke symptoms Derm: pruritus and skin lesion changes Psych: anxiety and depression  Objective   PE There were no vitals taken for this visit. Constitutional: NAD; conversant; no deformities Eyes: Moist conjunctiva; no lid lag; anicteric; PERRL Neck: Trachea midline; no thyromegaly Lungs: Normal respiratory effort; no tactile fremitus CV: RRR; no palpable thrills; no pitting edema GI: Abd Soft, nontender; no palpable hepatosplenomegaly MSK: Normal range of motion of extremities; no clubbing/cyanosis Psychiatric: Appropriate affect; alert and oriented x3 Lymphatic: No palpable cervical or  axillary lymphadenopathy  No results found for this or any previous visit (from the past 24 hours).  Imaging Orders  No imaging studies ordered today   CT Abd/Pel 08/23/23 Radiology impression 1. No acute findings in the abdomen pelvis. No explanation for LEFT lower quadrant pain. 2. Post bariatric surgery with Roux-en-Y anatomy. No complication. 3. Post cholecystectomy with stable prominence of the common bile duct.  On my review of the CT scan, I am concerned that the jejunal  anastomosis appears displaced out of the left upper quadrant into the right abdomen concerning for an internal hernia defect. This is a subtle finding, and I do not see any dilation of the remnant stomach or any swirl sign in the mesentery, but I feel this could relate to an internal hernia that would match with her symptoms.   Assessment and Plan   History of gastric bypass  Periumbilical abdominal pain   I am worried this patient has a history of gastric bypass and intermittent periumbilical abdominal pain. Her CT scan shows no acute abnormality, however on my review the jejunal anastomosis appears to the right of the midline displaced from its normal location in the left abdomen. This is all concerning for an internal hernia. She also has not had an upper endoscopy to rule out marginal ulcer. I recommend diagnostic laparoscopy, and upper endoscopy. This will be able to evaluate for and treat in internal hernia, and evaluate for marginal ulcer. We discussed the procedure itself as well as its risk, benefits, and alternatives. After full discussion all questions answered the patient granted consent to proceed.   Deward JINNY Foy, MD  [facility] Use [email or web address] to contact on call provider

## 2024-01-06 NOTE — Anesthesia Procedure Notes (Signed)
 Procedure Name: Intubation Date/Time: 01/06/2024 12:20 PM  Performed by: Mollie Olivia SAUNDERS, CRNAPre-anesthesia Checklist: Patient identified, Emergency Drugs available, Suction available and Patient being monitored Patient Re-evaluated:Patient Re-evaluated prior to induction Oxygen Delivery Method: Circle system utilized Preoxygenation: Pre-oxygenation with 100% oxygen Induction Type: IV induction Ventilation: Mask ventilation without difficulty Laryngoscope Size: Mac and 3 Tube type: Oral Tube size: 7.0 mm Number of attempts: 1 Airway Equipment and Method: Stylet and Oral airway Placement Confirmation: ETT inserted through vocal cords under direct vision, positive ETCO2 and breath sounds checked- equal and bilateral Secured at: 21 cm Tube secured with: Tape Dental Injury: Teeth and Oropharynx as per pre-operative assessment

## 2024-01-06 NOTE — Discharge Instructions (Signed)
Ulcer Treatment  Perscriptions: - OMEPRAZOLE (PRILOSEC OR MD APPROVED SUBSTITUTION) - CARAFATE (SUCRALFATE)  SYMPTOMS WILL START TO IMPROVE IN 10-14 DAYS  YOU MUST TAKE THE REGIMEN AS DESCRIBED BELOW FOR THE FULL 90 DAYS OF THERAPY FOR MAXIMUM RESULTS  Instructions: - OMEPRAZOLE UPON WAKING UP IN THE MORNING o (EAT BREAKFAST 30 MINUTES AFTER TAKING) o Open omeprazole capsule and mix it in 2 ounces of water and add a few drops of lemon/lime juice drink then drink as a slurry  - CARAFATE 30 MINUTES BEFORE EATING LUNCH o Crush the Carafate tablet and add to 2-3 ounces of room temperature water, drink as a slurry  - REPEAT STEP #1 30 MINUTES PRIOR TO EATING DINNER  - REPEAT STEP # 2 PRIOR TO BEDTIME  IMPORTANT: DO NOT take Omeprazole and Carafate within 30 minutes of each other!    Avoid caffeine, NSAIDS (aspirin, motrin, advil, ibuprofen, naproxen, aleve, goodie powder, or BC powder), or alcohol.  If you have any questions concerning your regimen, please the office: [phone]  Managing Peptic Ulcers  What is a Peptic Ulcer? ? An ulcer is the breakdown in the lining or the tissue under the lining of the stomach or other area of the intestines.  The breakdown in these tissues can cause damage by stomach acid and digestive enzymes which can lead to pain, burning, and bleeding. What causes peptic ulcers? ? Smoking ? Alcohol ? H. pylori bacteria ? Use of NSAIDS (Aspirin, ibuprofen, Motrin, Advil, Aleve, Naprosyn, Mobic, Meloxicam) ? Caffeine ? Coffee or black tea (including decaf)  The symptoms of peptic ulcers are:  Nausea  Vomiting  Loss of appetite  Unintentional weight loss  Burping or hiccups  Abdominal pain or discomfort  Back pain  Black/tarry stools  Diet/Food restrictions and recommendations for ulcers: ? Avoid coffee and black tea ? Avoid tobacco ? Avoid alcohol ? Avoid milk and dairy products (try soy milk or unsweetened almond milk products  instead) ? Avoid onions, tomatoes, and citrus fruitsEat 5-6 small meals daily ? Try: green tea or chamomile tea ? Start a probiotic (strains including L. acidophilus, L. plantarum, and B. lungum have been shown to be beneficial) ? Try: 1 teaspoon aloe vera juice after meals ? Consume food high in vitamin C:      dark leafy greens (kale, spinach), red bell peppers, asparagus, cabbage,  cauliflower, cantaloupe, honeydew melon, apples, kiwi, and berries ? Consume more protein: lean meats, soy cheese, eggs, fish, tofu  If symptoms continue, complete a food log and follow up with your bariatric dietitian and bariatric surgeon.

## 2024-01-06 NOTE — Op Note (Addendum)
 Operative Note  Barbara Meyer  [id number]  [id number]  01/06/2024   Surgeon: Mitzie Freund MD FACS   Procedure performed: upper endoscopy   Preop diagnosis: abdominal pain, prior gastric bypass, undergoing diagnostic laparoscopy by Dr. Lyndel Post-op diagnosis/intraop findings: focal irritation of roux limb, pouch 3-4cm while fully distended   Specimens: no Retained items: no  EBL: 0 cc Complications: none   Description of procedure: Patient is undergoing diagnostic laparoscopy for abdominal pain in the setting of prior retrocolic gastric bypass. Dr. Lyndel has repaired a small defect at [facility] space.  The endoscope was inserted and under direct visualization advanced to the GE junction which was found at approximately 36cm from the gums. The z line appears regular. No overt hiatal hernia. The pouch is tensely insufflated and examined. This is a small volume pouch with no evidence of retained fundus. The pouch measures 3-4cm in length while fully insufflated. The gastric mucosa appears normal. The gastrojejunal anastomosis is widely patent. There is some visible permanent suture material intermittently around the anastomosis without adjacent inflammatory change. There is focal irritation of the roux limb centrally looking just across the anastomosis, without overt ulceration or bleeding. There is no significant blind pouch/candy cane present. The remainder of the proximal roux limb appears normal. The bowel was desufflated and the endoscope removed.

## 2024-01-06 NOTE — Anesthesia Preprocedure Evaluation (Addendum)
 Anesthesia Evaluation  Patient identified by MRN, date of birth, ID band Patient awake    Reviewed: Allergy & Precautions, NPO status , Patient's Chart, lab work & pertinent test results  History of Anesthesia Complications Negative for: history of anesthetic complications  Airway Mallampati: II  TM Distance: >3 FB Neck ROM: Full    Dental  (+) Edentulous Upper, Edentulous Lower, Dental Advisory Given   Pulmonary neg shortness of breath, neg sleep apnea, neg COPD, neg recent URI, former smoker   Pulmonary exam normal breath sounds clear to auscultation       Cardiovascular hypertension, (-) angina (-) Past MI, (-) Cardiac Stents and (-) CABG (-) dysrhythmias  Rhythm:Regular Rate:Normal     Neuro/Psych neg Seizures PSYCHIATRIC DISORDERS (PTSD) Anxiety Depression Bipolar Disorder    Neuromuscular disease (tremors from psych meds)    GI/Hepatic ,neg GERD  ,,(+) Cirrhosis       , Hepatitis - (treated), CS/p gastric bypass, H/o anal cancer s/p chemo/radiation   Endo/Other  negative endocrine ROS    Renal/GU negative Renal ROS     Musculoskeletal  (+) Arthritis ,    Abdominal   Peds  Hematology  (+) Blood dyscrasia, anemia Lab Results      Component                Value               Date                      WBC                      4.4                 12/24/2023                HGB                      11.2 (L)            12/24/2023                HCT                      37.3                12/24/2023                MCV                      91.0                12/24/2023                PLT                      222                 12/24/2023              Anesthesia Other Findings   Reproductive/Obstetrics                             Anesthesia Physical Anesthesia Plan  ASA: 3  Anesthesia Plan: General   Post-op Pain Management:    Induction: Intravenous  PONV Risk Score and Plan: 3  and Ondansetron , Dexamethasone  and  Treatment may vary due to age or medical condition  Airway Management Planned: Oral ETT  Additional Equipment:   Intra-op Plan:   Post-operative Plan: Extubation in OR  Informed Consent: I have reviewed the patients History and Physical, chart, labs and discussed the procedure including the risks, benefits and alternatives for the proposed anesthesia with the patient or authorized representative who has indicated his/her understanding and acceptance.     Dental advisory given  Plan Discussed with: CRNA and Anesthesiologist  Anesthesia Plan Comments: (Risks of general anesthesia discussed including, but not limited to, sore throat, hoarse voice, chipped/damaged teeth, injury to vocal cords, nausea and vomiting, allergic reactions, lung infection, heart attack, stroke, and death. All questions answered. )        Anesthesia Quick Evaluation

## 2024-01-06 NOTE — Anesthesia Postprocedure Evaluation (Signed)
 Anesthesia Post Note  Patient: Barbara Meyer  Procedure(s) Performed: LAPAROSCOPY DIAGNOSTIC UPPER ENDOSCOPY, LAPAROSCOPIC CLOSURE OF PETERSON'S SPACE     Patient location during evaluation: PACU Anesthesia Type: General Level of consciousness: awake Pain management: pain level controlled Vital Signs Assessment: post-procedure vital signs reviewed and stable Respiratory status: spontaneous breathing, nonlabored ventilation and respiratory function stable Cardiovascular status: blood pressure returned to baseline and stable Postop Assessment: no apparent nausea or vomiting Anesthetic complications: no   No notable events documented.  Last Vitals:  Vitals:   01/06/24 1500 01/06/24 1515  BP: 112/79 116/75  Pulse: 63 63  Resp: 12 14  Temp:    SpO2: 91% 92%    Last Pain:  Vitals:   01/06/24 1515  TempSrc:   PainSc: 0-No pain                 Delon Aisha Arch

## 2024-01-06 NOTE — Op Note (Signed)
 Patient: Barbara Meyer (1961/01/08, [id number])  Date of Surgery: 01/06/2024  Preoperative Diagnosis: ABDOMINAL PAIN AFTER GASTRIC BYPASS   Postoperative Diagnosis: ABDOMINAL PAIN AFTER GASTRIC BYPASS   Surgical Procedure: LAPAROSCOPY DIAGNOSTIC: 50679 (CPT) UPPER ENDOSCOPY, LAPAROSCOPIC CLOSURE OF PETERSON'S SPACE: [id number]   Operative Team Members:  Surgeons and Role:    * Rainah Kirshner, Deward PARAS, MD - Primary    * Signe Mitzie LABOR, MD   Anesthesiologist: Peggye Delon Brunswick, MD CRNA: Mollie Olivia SAUNDERS, CRNA   Anesthesia: General   Fluids:  Total I/O In: 350 [I.V.:350] Out: -   Complications: * No complications entered in OR log *  Drains:  none   Specimen: * No specimens in log *   Disposition:  PACU - hemodynamically stable.  Plan of Care: Discharge to home after PACU    Indications for Procedure: Su Duma is a 63 y.o. female who presented with a history of gastric bypass and periumbilical abdominal pain.  I am worried this patient has a history of gastric bypass and intermittent periumbilical abdominal pain. Her CT scan shows no acute abnormality, however on my review the jejunal anastomosis appears to the right of the midline displaced from its normal location in the left abdomen. This is all concerning for an internal hernia. She also has not had an upper endoscopy to rule out marginal ulcer. I recommend diagnostic laparoscopy, and upper endoscopy. This will be able to evaluate for and treat in internal hernia, and evaluate for marginal ulcer. We discussed the procedure itself as well as its risk, benefits, and alternatives. After full discussion all questions answered the patient granted consent to proceed.  Findings: Small peterson's defect closed with a silk suture.  Small gastric pouch.  Some jejunitis just distal to the gastrojejunal anastomosis   Description of Procedure:   On the date stated above the patient was taken operating room suite  and placed in supine position.  General endotracheal anesthesia was induced.  Patient's abdomen prepped draped in sterile fashion.  Timeout was completed verifying correct patient, procedure, position, and equipment needed for the case.  I entered the abdomen the left upper quadrant using an optical technique.  The abdomen is inflated to 15 mmHg.  3 additional 5 mm trocars were placed across the lower abdomen.  I identified the ligament of Treitz around the common channel back to the jejunojejunostomy.  The anastomosis was inspected.  It appeared to have appropriate orientation.  The jejunojejunal mesenteric defect was closed.  I attempted take a photo of this using the EMR.  The hepatobiliary limb was run back to the ligament of Treitz and appeared normal.  The Roux limb was run proximally.  The patient had a retrocolic anatomy.  The transverse colon mesenteric defect appeared closed.  There was a very small opening of the Petersons defect.  This was closed using a figure-of-eight silk suture.  There was a adhesion between the falciform ligament the omentum which was divided sharply.  There were no other abnormalities noted within the abdomen.  We deflated the abdomen and the skin was closed using 4-0 Monocryl and Dermabond.  Dr. Cameron then performed the upper endoscopy.  Please see her note for more detail, but in general there was some mild jejunitis without clear ulceration just distal to the gastrojejunal anastomosis and a small gastric pouch.  No other abnormalities noted on upper endoscopy.  The patient was awoken from anesthesia and transferred the postanesthesia care unit in stable condition.  All sponge needle counts  were correct at the end of this case.  At the end of the case we reviewed the infection status of the case. Patient: Private Patient Elective Case Case: Elective Infection Present At Time Of Surgery (PATOS): None  Deward Foy, MD General, Bariatric, & Minimally Invasive  [facility], [place]

## 2024-01-06 NOTE — Transfer of Care (Signed)
 Immediate Anesthesia Transfer of Care Note  Patient: Barbara Meyer  Procedure(s) Performed: LAPAROSCOPY DIAGNOSTIC UPPER ENDOSCOPY, LAPAROSCOPIC CLOSURE OF PETERSON'S SPACE  Patient Location: PACU  Anesthesia Type:General  Level of Consciousness: awake, alert , oriented, and drowsy  Airway & Oxygen Therapy: Patient Spontanous Breathing and Patient connected to nasal cannula oxygen  Post-op Assessment: Report given to RN, Post -op Vital signs reviewed and stable, and Patient moving all extremities  Post vital signs: Reviewed and stable  Last Vitals:  Vitals Value Taken Time  BP 113/65 01/06/24 1325  Temp    Pulse 68 01/06/24 1327  Resp 19 01/06/24 1327  SpO2 100 % 01/06/24 1327  Vitals shown include unfiled device data.  Last Pain:  Vitals:   01/06/24 1155  TempSrc:   PainSc: 0-No pain         Complications: No notable events documented.

## 2024-01-07 ENCOUNTER — Encounter (HOSPITAL_COMMUNITY): Payer: Self-pay | Admitting: Surgery

## 2024-01-16 ENCOUNTER — Telehealth (HOSPITAL_COMMUNITY): Payer: Medicaid Other | Admitting: Psychiatry

## 2024-01-16 ENCOUNTER — Encounter (HOSPITAL_COMMUNITY): Payer: Self-pay | Admitting: Psychiatry

## 2024-01-16 DIAGNOSIS — F411 Generalized anxiety disorder: Secondary | ICD-10-CM

## 2024-01-16 DIAGNOSIS — F332 Major depressive disorder, recurrent severe without psychotic features: Secondary | ICD-10-CM

## 2024-01-16 DIAGNOSIS — F603 Borderline personality disorder: Secondary | ICD-10-CM | POA: Diagnosis not present

## 2024-01-16 DIAGNOSIS — F4310 Post-traumatic stress disorder, unspecified: Secondary | ICD-10-CM

## 2024-01-16 DIAGNOSIS — Z79899 Other long term (current) drug therapy: Secondary | ICD-10-CM

## 2024-01-16 DIAGNOSIS — F319 Bipolar disorder, unspecified: Secondary | ICD-10-CM

## 2024-01-16 DIAGNOSIS — R45851 Suicidal ideations: Secondary | ICD-10-CM

## 2024-01-16 DIAGNOSIS — F410 Panic disorder [episodic paroxysmal anxiety] without agoraphobia: Secondary | ICD-10-CM

## 2024-01-16 MED ORDER — FLUOXETINE HCL 40 MG PO CAPS: 40.0000 mg | ORAL_CAPSULE | Freq: Every day | ORAL | 2 refills | Status: DC

## 2024-01-16 MED ORDER — LITHIUM CARBONATE 150 MG PO CAPS: 150.0000 mg | ORAL_CAPSULE | Freq: Every evening | ORAL | 0 refills | Status: DC

## 2024-01-16 NOTE — Patient Instructions (Signed)
We decreased the lithium to 150 mg nightly for the next 2 weeks and then you will discontinue after that.  They will give [place] a 2-week window with not being on lithium to see if this improves that tremor.  If your primary care doctor has not evaluated this yet you may want to touch base with them as well.

## 2024-01-16 NOTE — Progress Notes (Signed)
BH MD Outpatient Progress Note  01/16/2024 10:54 AM Barbara Meyer  MRN:  [id number]  Assessment:  Barbara Meyer presents for follow-up evaluation. Today, 01/16/24, patient reports overall stability of both anxiety and depression but has found that gnome collecting is a new passion and is able to smile and laugh when discussing this.  She is still having the bilateral intention tremor of hands but with her polypharmacy would be difficult to determine which of her medications could be contributing to this; she would be amenable to taper discontinuation of lithium.  Still has propranolol available as needed to assist.  Thankfully was able to have surgery which significantly improved her pain which is also likely improving her mood after finding a peptic ulcer and removing scar tissue from her stomach.  Overall still consistent with borderline personality disorder and suicidal ideation is down to once weekly and still no plan. On that note, usually in response to relationship injury or when hopeless about ongoing abdominal and back pain.  Sleeping about 11-12 hours a night but by setting an alarm was able to reduce this to 9 hours.  Thyroid studies within normal limits. She also needs lipid panel, A1c while on Seroquel and we will plan on coming by the office to get those sets of labs or whenever she sees her PCP next.  Binge eating still roughly the same but self-harm slightly improved with not noticing the scratching sensation anymore which is an improvement.  Until suicidal ideation has been resolved for a longer period of time we will need to keep the number of pills on hand at a low amount.  Depending on how discontinuation of lithium goes may consider Prozac titration at next appointment.  Follow-up in 1 month.   For safety, her acute risk factors for suicide are: Diagnosis of borderline personality disorder, current diagnosis of depression, self-harm by scratching, suicidal ideation without intent or  plan.  Her chronic risk factors for suicide are: History of substance use, history of alcohol use disorder, historical diagnosis of bipolar disorder, diagnosed borderline personality disorder, social isolation.  Her protective factors are: Employment, supportive family, beloved pets in the home, actively seeking and engaging with mental health care no suicidal ideation, no access to firearms, hope for the future.  While future events cannot be fully predicted she does not currently meet IVC criteria and can be continued as an outpatient.  Identifying Information: Barbara Meyer is a 63 y.o. 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 Initial evaluation of depression and anxiety on 07/01/23; please see that note for full case formulation.  We will plan on discontinuing lithium as next agent as at its current dose it is unlikely to be providing much benefit anyway.  Was able to get a lithium level that was drawn by gastroenterologist and found to be 0.4 but as previously discussed much lower likelihood that she has a bipolar spectrum of illness and much more likely this is a primary borderline personality disorder which would not require higher doses of lithium.  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 caffeine use and history of snoring Past medication trials:  Status of problem: Improving Interventions: -- Patient to cut back on caffeine use --Seroquel, Prozac as above   # History of vitamin D and iron deficiency with history of gastric bypass surgery Past medication trials:  Status of problem: Chronic and stable Interventions: -- Coordinate with PCP for vitamin D level and consider nutrition referral   # High risk medication use: Lithium and Lamictal Past medication trials:  Status of problem: Chronic and stable Interventions: --Lithium level on 07/24/2023 0.4, CMP within normal limits on 08/23/23 -- Thyroid panel normal as of 12/03/2023   # Long-term current use of antipsychotic: Seroquel Past medication trials:  Status of problem: Chronic and stable Interventions: -- EKG with QTc of 455 ms on 09/23/2023 --Still needs updated A1c and lipid panel  # Abdominal pain with peptic ulcer Past medication trials:  Status of problem:  Improving Interventions: -- Currently followed by GI    # History of tobacco use disorder in sustained remission Past medication trials:  Status of problem: In remission Interventions: -- Continue to encourage abstinence  Patient was given contact information for behavioral health clinic and was instructed to call 911 for emergencies.   Subjective:  Chief Complaint:  Chief Complaint  Patient presents with   Borderline personality disorder   Anxiety   Depression   Follow-up    Interval History: Doing ok this morning, holding steady. Still shaking a little bit as before. Still finding she is pretending to be happy without actually being happy. However, she did start collecting gnomes which is making her happy. Would be amenable to cutting back and discontinuing lithium. Will get home around 1045p and stays up for a bit watching tv but then will sleep for 11-12hrs after taking medication around 1a and falls asleep within . May try setting an alarm to cut back on the amount of sleep. Still feels tired until she drinks coffee but then feels fine. Had surgery on the 13th with bariatric surgeon and found a peptic ulcer and cut out scar tissue from radiation in her stomach. Was able to go back to work after 4 days but still on light duty. Had a fall on her birthday. SI is about the same with coming and going and still no plan. Will consider prozac titration at next appointment.  Visit Diagnosis:    ICD-10-CM   1. Borderline personality disorder (HCC)  F60.3 lithium carbonate 150 MG capsule    2. Major depressive disorder, recurrent severe without psychotic features (  HCC)  F33.2 lithium carbonate 150 MG capsule    FLUoxetine (PROZAC) 40 MG capsule    3. History of bipolar disorder  F31.9 lithium carbonate 150 MG capsule    4. Generalized anxiety disorder with panic attacks  F41.1 FLUoxetine (PROZAC) 40 MG capsule   F41.0     5. PTSD (post-traumatic stress disorder)  F43.10 FLUoxetine  (PROZAC) 40 MG capsule    6. Suicidal ideation without plan or intent  R45.851 FLUoxetine (PROZAC) 40 MG capsule    7. Long-term use of high-risk medication: Lithium  Z79.899     8. 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, history of alcohol use disorder in sustained remission, history of tobacco use disorder in sustained remission Medication trials: effexor (doesn't remember), lithium (hard to say if working and possible tremor), seroquel (hard to say if working), latuda (hard to say if worked), trazodone (doesn't remember), hydroxyzine (doesn't remember), lamictal (doesn't remember), propranolol, wellbutrin (felt like being on meth again and was more isolative), Prozac (effective) Previous psychiatrist/therapist: yes to both Hospitalizations: none Suicide attempts: none SIB: scratches at times on arms Hx of violence towards others: none Current access to guns: none Hx of trauma/abuse: none Substance use: No alcohol at present, has been 25 years since last drink as a recovering alcoholic since 1999. No tobacco products since in roughly 2002. No other drugs at present. Quit methamphetamine in 1999 as well; used IV and was treated for hepatitis C.   Past Medical History:  Past Medical History:  Diagnosis Date   Anemia    Anxiety    Arthritis    Bipolar 1 disorder (HCC)    Borderline personality disorder (HCC)    Cancer (HCC) 2021   Anal cancer   had Chemo/ Radiation in [place]   Depression    Hepatitis    HEP C, treated in past   Hypertension    Liver cirrhosis (HCC)    Neuromuscular disorder (HCC)     tremors from psych meds.   PTSD (post-traumatic stress disorder)     Past Surgical History:  Procedure Laterality Date   CHOLECYSTECTOMY     Laparoscopic   DILATION AND CURETTAGE OF UTERUS     EYE SURGERY Bilateral    cataract removal   GASTRIC BYPASS  10/2019   Roux-n-Y  done in [place]   HERNIA REPAIR  2022   hernia repair and mass removal   LAPAROSCOPY N/A 01/06/2024   Procedure: LAPAROSCOPY DIAGNOSTIC;  Surgeon: Quentin Ore, MD;  Location: [facility];  Service: General;  Laterality: N/A;   SKIN SURGERY     had large abscess on left shoulder blade that was drained and debrided   UPPER GI ENDOSCOPY N/A 01/06/2024   Procedure: UPPER ENDOSCOPY, LAPAROSCOPIC CLOSURE OF PETERSON'S SPACE;  Surgeon: Quentin Ore, MD;  Location: [facility];  Service: General;  Laterality: N/A;   VAGINAL HYSTERECTOMY     WRIST FRACTURE SURGERY Left 12/20/2020    Family Psychiatric History: son on medication for bipolar and depression, mother/father alcoholic   Family History:  Family History  Problem Relation Age of Onset   Brain cancer Brother    Rectal cancer Maternal Uncle  met to bones    Social History:  Academic/Vocational: Works at [employer]   Social History   Socioeconomic History   Marital status: Single    Spouse name: Not on file   Number of children: Not on file   Years of education: Not on file   Highest education level: Not on file  Occupational History   Not on file  Tobacco Use   Smoking status: Former    Current packs/day: 0.00    Types: Cigarettes    Quit date: 2002    Years since quitting: 23.0   Smokeless tobacco: Never  Vaping Use   Vaping status: Never Used  Substance and Sexual Activity   Alcohol use: Not Currently    Comment: Sober since 1999   Drug use: Not Currently    Types: Methamphetamines    Comment: Sober since 1999   Sexual activity: Not Currently  Other Topics Concern   Not on file  Social History Narrative   Not on  file   Social Drivers of Health   Financial Resource Strain: Low Risk  (09/20/2021)   Overall Financial Resource Strain (CARDIA)    Difficulty of Paying Living Expenses: Not hard at all  Food Insecurity: No Food Insecurity (09/20/2021)   Hunger Vital Sign    Worried About Running Out of Food in the Last Year: Never true    Ran Out of Food in the Last Year: Never true  Transportation Needs: No Transportation Needs (09/20/2021)   PRAPARE - Administrator, Civil Service (Medical): No    Lack of Transportation (Non-Medical): No  Physical Activity: Inactive (09/20/2021)   Exercise Vital Sign    Days of Exercise per Week: 0 days    Minutes of Exercise per Session: 0 min  Stress: Stress Concern Present (09/20/2021)   [employer] of Occupational Health - Occupational Stress Questionnaire    Feeling of Stress : To some extent  Social Connections: Moderately Isolated (09/20/2021)   Social Connection and Isolation Panel [NHANES]    Frequency of Communication with Friends and Family: More than three times a week    Frequency of Social Gatherings with Friends and Family: More than three times a week    Attends Religious Services: More than 4 times per year    Active Member of [employer] or Organizations: No    Attends Banker Meetings: Never    Marital Status: Never married    Allergies:  Allergies  Allergen Reactions   Lisinopril Cough    Current Medications: Current Outpatient Medications  Medication Sig Dispense Refill   acetaminophen (TYLENOL) 500 MG tablet Take 1,500 mg by mouth 2 (two) times daily as needed for moderate pain.     FLUoxetine (PROZAC) 40 MG capsule Take 1 capsule (40 mg total) by mouth at bedtime. 30 capsule 2   lamoTRIgine (LAMICTAL) 200 MG tablet Take 1 tablet (200 mg total) by mouth daily. 30 tablet 2   lithium carbonate 150 MG capsule Take 1 capsule (150 mg total) by mouth at bedtime. 14 capsule 0   loperamide (IMODIUM A-D) 2 MG tablet Take  2 mg by mouth 3 (three) times daily as needed for diarrhea or loose stools.     Multiple Vitamin (MULTIVITAMIN) tablet Take 1 tablet by mouth daily.     pantoprazole (PROTONIX) 40 MG tablet Take 1 tablet (40 mg total) by mouth daily. 90 tablet 0   propranolol (INDERAL) 10 MG tablet TAKE 1 TABLET (10 MG TOTAL) BY  MOUTH 2 (TWO) TIMES DAILY AS NEEDED (ANXIETY/PANIC) 60 tablet 2   QUEtiapine (SEROQUEL) 200 MG tablet Take 1 tablet (200 mg total) by mouth at bedtime. 30 tablet 2   sucralfate (CARAFATE) 1 g tablet Take 1 tablet (1 g total) by mouth 2 (two) times daily. 180 tablet 3   No current facility-administered medications for this visit.    ROS: Review of Systems  Constitutional:  Positive for appetite change and unexpected weight change.  HENT:         Dental implants  Gastrointestinal:  Positive for abdominal pain and diarrhea. Negative for constipation, nausea and vomiting.  Endocrine: Positive for cold intolerance, heat intolerance and polyphagia.  Musculoskeletal:  Positive for back pain. Negative for arthralgias.  Skin:        No hair loss  Neurological:  Positive for tremors and headaches. Negative for dizziness.  Psychiatric/Behavioral:  Positive for decreased concentration, dysphoric mood, sleep disturbance and suicidal ideas. Negative for hallucinations and self-injury. The patient is nervous/anxious.     Objective:  Psychiatric Specialty Exam: There were no vitals taken for this visit.There is no height or weight on file to calculate BMI.  General Appearance: Casual, Fairly Groomed, and appears stated age  Eye Contact:  Fair  Speech:  Normal Rate and slight impairment to articulation with dental implants  Volume:  Normal  Mood:   "Okay, holding steady"  Affect:  Appropriate, Congruent, Depressed, and less anxious but with spontaneous smile and laugh  Thought Content: Logical and Hallucinations: Tactile   Suicidal Thoughts:  None in session today but still intermittent and  passive when occurring usually in response to relationship injury or thinking about ongoing abdominal pain; down to once weekly  Homicidal Thoughts:  No  Thought Process:  Coherent, Goal Directed, and Linear  Orientation:  Full (Time, Place, and Person)    Memory:  Grossly intact   Judgment:  Fair  Insight:  Fair  Concentration:  Concentration: Fair  Recall:  not formally assessed   Fund of Knowledge: Fair  Language: Fair  Psychomotor Activity:  Normal  Akathisia:  No  AIMS (if indicated): Done, 0 as tremor of bilateral hands not included  Assets:  Communication Skills Desire for Improvement Financial Resources/Insurance Housing Leisure Time Resilience Social Support Talents/Skills Transportation Vocational/Educational  ADL's:  Intact  Cognition: WNL  Sleep:  Fair   PE: General: sits comfortably in view of camera; no acute distress Pulm: no increased work of breathing on room air  MSK: all extremity movements appear intact  Neuro: no focal neurological deficits observed,, intention tremor in bilateral hands Gait & Station: unable to assess by video    Metabolic Disorder Labs: No results found for: "HGBA1C", "MPG" No results found for: "PROLACTIN" No results found for: "CHOL", "TRIG", "HDL", "CHOLHDL", "VLDL", "LDLCALC" Lab Results  Component Value Date   TSH 1.450 12/03/2023    Therapeutic Level Labs: No results found for: "LITHIUM" No results found for: "VALPROATE" No results found for: "CBMZ"  Screenings:  PHQ2-9    Flowsheet Row Office Visit from 07/01/2023 in [facility] [place] Office Visit from 09/20/2021 in [facility]  PHQ-2 Total Score 6 2  PHQ-9 Total Score 18 6      Flowsheet Row Admission (Discharged) from 01/06/2024 in [place] [facility] ED from 11/01/2023 in [facility] at [facility] ED from 09/21/2023 in [facility] Emergency Department  at [facility]  C-SSRS RISK CATEGORY No Risk No Risk No Risk       Collaboration of Care: Collaboration of Care: Medication Management AEB as above and Primary Care Provider AEB as above  Patient/Guardian was advised Release of Information must be obtained prior to any record release in order to collaborate their care with an outside provider. Patient/Guardian was advised if they have not already done so to contact the registration department to sign all necessary forms in order for [place] to release information regarding their care.   Consent: Patient/Guardian gives verbal consent for treatment and assignment of benefits for services provided during this visit. Patient/Guardian expressed understanding and agreed to proceed.   Televisit via video: I connected with patient on 01/16/24 at 10:30 AM EST by a video enabled telemedicine application and verified that I am speaking with the correct person using two identifiers.  Location: Patient: At home Provider: remote office in [place]   I discussed the limitations of evaluation and management by telemedicine and the availability of in person appointments. The patient expressed understanding and agreed to proceed.  I discussed the assessment and treatment plan with the patient. The patient was provided an opportunity to ask questions and all were answered. The patient agreed with the plan and demonstrated an understanding of the instructions.   The patient was advised to call back or seek an in-person evaluation if the symptoms worsen or if the condition fails to improve as anticipated.  I provided 20 minutes dedicated to the care of this patient via video on the date of this encounter to include chart review, face-to-face time with the patient, medication management/counseling, coordination of care with PCP.  Elsie Lincoln, MD 01/16/2024, 10:54 AM

## 2024-01-18 ENCOUNTER — Other Ambulatory Visit (HOSPITAL_COMMUNITY): Payer: Self-pay | Admitting: Psychiatry

## 2024-01-18 DIAGNOSIS — F319 Bipolar disorder, unspecified: Secondary | ICD-10-CM

## 2024-01-18 DIAGNOSIS — F603 Borderline personality disorder: Secondary | ICD-10-CM

## 2024-01-18 DIAGNOSIS — F410 Panic disorder [episodic paroxysmal anxiety] without agoraphobia: Secondary | ICD-10-CM

## 2024-02-10 ENCOUNTER — Telehealth (INDEPENDENT_AMBULATORY_CARE_PROVIDER_SITE_OTHER): Payer: Medicaid Other | Admitting: Psychiatry

## 2024-02-10 ENCOUNTER — Encounter (HOSPITAL_COMMUNITY): Payer: Self-pay | Admitting: Psychiatry

## 2024-02-10 DIAGNOSIS — F332 Major depressive disorder, recurrent severe without psychotic features: Secondary | ICD-10-CM | POA: Diagnosis not present

## 2024-02-10 DIAGNOSIS — F319 Bipolar disorder, unspecified: Secondary | ICD-10-CM

## 2024-02-10 DIAGNOSIS — R45851 Suicidal ideations: Secondary | ICD-10-CM

## 2024-02-10 DIAGNOSIS — T391X1A Poisoning by 4-Aminophenol derivatives, accidental (unintentional), initial encounter: Secondary | ICD-10-CM | POA: Insufficient documentation

## 2024-02-10 DIAGNOSIS — F4310 Post-traumatic stress disorder, unspecified: Secondary | ICD-10-CM | POA: Diagnosis not present

## 2024-02-10 DIAGNOSIS — F603 Borderline personality disorder: Secondary | ICD-10-CM

## 2024-02-10 DIAGNOSIS — F411 Generalized anxiety disorder: Secondary | ICD-10-CM

## 2024-02-10 DIAGNOSIS — Z79899 Other long term (current) drug therapy: Secondary | ICD-10-CM

## 2024-02-10 DIAGNOSIS — F410 Panic disorder [episodic paroxysmal anxiety] without agoraphobia: Secondary | ICD-10-CM

## 2024-02-10 MED ORDER — LITHIUM CARBONATE 150 MG PO CAPS: 150.0000 mg | ORAL_CAPSULE | Freq: Every evening | ORAL | 0 refills | Status: DC

## 2024-02-10 MED ORDER — FLUOXETINE HCL 40 MG PO CAPS: 40.0000 mg | ORAL_CAPSULE | Freq: Every day | ORAL | 2 refills | Status: DC

## 2024-02-10 MED ORDER — FLUOXETINE HCL 20 MG PO CAPS: 20.0000 mg | ORAL_CAPSULE | Freq: Every evening | ORAL | 2 refills | Status: DC

## 2024-02-10 NOTE — Progress Notes (Signed)
 [facility] MD Outpatient Progress Note  02/10/2024 11:57 AM Barbara Meyer  MRN:  [id number]  Assessment:  Barbara Meyer presents for follow-up evaluation. Today, 02/10/24, patient reports overall stability of both anxiety and depression with the latter somewhat improved and she is reporting a good mood consistently now.  This is at odds with her ongoing borderline personality disorder and chronic passive suicidal ideation and the biggest protective factor is not wanting to cause her children harm from her own suicide at this point.  Therefore is not made any plans in some time.  She is still having the bilateral intention tremor of hands but with her polypharmacy would be difficult to determine which of her medications could be contributing to this; she would be amenable to taper discontinuation of lithium which unfortunately did not start last appointment due to pharmacy error.  Still has propranolol available as needed to assist.  Encouraged her to follow up with primary care about ongoing falls and pain related to this.  She was excessively using Tylenol of 1500 mg every 4 hours with a daily dose roughly 9000 mg which was more than double the 24-hour allotment.  She will follow up with PCP about this and decreased to 1500 every 8 hours max. Thankfully was able to have surgery which significantly improved her pain which is also likely improving her mood after finding a peptic ulcer and removing scar tissue from her stomach and will have checkup for this on the 27th.  Sleeping about 11-12 hours a night but by setting an alarm was able to reduce this to 9 hours.  Thyroid studies within normal limits. She also needs lipid panel, A1c while on Seroquel and we will plan on coming by the office to get those sets of labs or whenever she sees her PCP next.  Binge eating still roughly the same but self-harm slightly improved with not noticing the scratching sensation anymore which is an improvement.  Until suicidal ideation  has been resolved for a longer period of time we will need to keep the number of pills on hand at a low amount.  She was amenable to titration of Prozac and will stagger this with discontinuation of lithium due to ongoing tremor.  Follow-up in 6 weeks.   For safety, her acute risk factors for suicide are: Diagnosis of borderline personality disorder, current diagnosis of depression, self-harm by scratching, suicidal ideation without intent or plan.  Her chronic risk factors for suicide are: History of substance use, history of alcohol use disorder, historical diagnosis of bipolar disorder, diagnosed borderline personality disorder, social isolation.  Her protective factors are: Employment, supportive family, beloved pets in the home, actively seeking and engaging with mental health care no suicidal ideation, no access to firearms, hope for the future.  While future events cannot be fully predicted she does not currently meet IVC criteria and can be continued as an outpatient.  Identifying Information: Barbara Meyer is a 63 y.o. 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 Initial evaluation of depression and anxiety  on 07/01/23; please see that note for full case formulation.  We will plan on discontinuing lithium as next agent as at its current dose it is unlikely to be providing much benefit anyway.  Was able to get a lithium  level that was drawn by gastroenterologist and found to be 0.4 but as previously discussed much lower likelihood that she has a bipolar spectrum of illness and much more likely this is a primary borderline personality disorder which would not require higher doses of lithium.   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 caffeine use and history of snoring Past medication trials:  Status of problem: Chronic and stable Interventions: -- Patient to cut back on caffeine use --Seroquel, Prozac as above   # History of vitamin D and iron deficiency with history of gastric bypass surgery Past medication trials:  Status of problem: Chronic and stable Interventions: -- Coordinate with PCP for vitamin D level and consider nutrition referral   # High risk medication use: Lithium and Lamictal Past medication trials:  Status of  problem: Chronic and stable Interventions: --Lithium level on 07/24/2023 0.4, CMP within normal limits on 08/23/23 -- Thyroid panel normal as of 12/03/2023   # Long-term current use of antipsychotic: Seroquel Past medication trials:  Status of problem: Chronic and stable Interventions: -- EKG with QTc of 455 ms on 09/23/2023 --Still needs updated A1c and lipid panel  # Abdominal pain with peptic ulcer status post resection in January 2025 Past medication trials:  Status of problem: Improving Interventions: -- Currently followed by GI   # Falls with chronic hip pain and Tylenol overuse Past medication trials:  Status of problem: New to provider Interventions: -- Decrease acetaminophen to no more than 1000 mg every 8 hours --Encourage follow-up with PCP --Reduce polypharmacy were able   # History of tobacco use disorder in sustained remission Past medication trials:  Status of problem: In remission Interventions: -- Continue to encourage abstinence  Patient was given contact information for behavioral health clinic and was instructed to call 911 for emergencies.   Subjective:  Chief Complaint:  No chief complaint on file.   Interval History: Doing alright this morning and overall feeling good, but the pharmacy never filled the 150mg  of the lithium. They did fill the lamotrigine instead of the lithium and ended up going without for 2 days. Therefore things are still the same.  Still shaking a little bit as before. Still planning on discontinuing the lithium after taper and will try to call that in again.  Will get home around  1045p and stays up for a bit watching tv but then will sleep for 11-12hrs after taking medication around 1a and falls asleep within but still waking up frequently during the night. May try setting an alarm to cut back on the amount of sleep but hasn't tried this yet. Still feels tired until she drinks coffee but then feels fine. Surgery post peptic ulcer  still having some pain and will get a check up on the 27th. Back to full duty at work. Has continued to fall two more times since her birthday. One was after kicking a truck trailer with her knee causing a fall and the other was almost in the shower. Also has some left hip to knee pain that she has been taking tylenol every 4 hours after the fall in the store but is 1500mg  each time; reviewed need to cut back to protect liver. Able to joke about physical pain. SI is about the same with coming and going and still no plan; had a long heart to heart with her son in [place] and not wanting to hurt her children with SI is main protective factor.  Was amenable to titration of fluoxetine today but will stagger with discontinuation of lithium due to tremor.  Visit Diagnosis:    ICD-10-CM   1. Borderline personality disorder (HCC)  F60.3 lithium carbonate 150 MG capsule    2. Major depressive disorder, recurrent severe without psychotic features (HCC)  F33.2 lithium carbonate 150 MG capsule    FLUoxetine (PROZAC) 20 MG capsule    FLUoxetine (PROZAC) 40 MG capsule    3. History of bipolar disorder  F31.9     4. PTSD (post-traumatic stress disorder)  F43.10 FLUoxetine (PROZAC) 20 MG capsule    FLUoxetine (PROZAC) 40 MG capsule    5. Suicidal ideation without plan or intent  R45.851 FLUoxetine (PROZAC) 40 MG capsule    6. Long-term use of high-risk medication: Lithium  Z79.899     7. Long term current use of antipsychotic medication  Z79.899     8. Generalized anxiety disorder with panic attacks  F41.1 FLUoxetine (PROZAC) 20 MG capsule   F41.0 FLUoxetine (PROZAC) 40 MG capsule        Past Psychiatric History:  Diagnoses: Borderline personality disorder with self-harm scratching, historical diagnosis of bipolar 1 disorder, PTSD, major depressive disorder, generalized anxiety disorder with panic attacks, vitamin D deficiency, iron deficiency with a history of gastric bypass surgery, history of  methamphetamine use in sustained remission, history of alcohol use disorder in sustained remission, history of tobacco use disorder in sustained remission Medication trials: effexor (doesn't remember), lithium (hard to say if working and possible tremor), seroquel (hard to say if working), latuda (hard to say if worked), trazodone (doesn't remember), hydroxyzine (doesn't remember), lamictal (doesn't remember), propranolol, wellbutrin (felt like being on meth again and was more isolative), Prozac (effective) Previous psychiatrist/therapist: yes to both Hospitalizations: none Suicide attempts: none SIB: scratches at times on arms Hx of violence towards others: none Current access to guns: none Hx of trauma/abuse: none Substance use: No alcohol at present, has been 25 years since last drink as a recovering alcoholic since 1999. No tobacco products since in roughly 2002. No other drugs at present. Quit methamphetamine in 1999 as well; used IV and was treated for hepatitis C.   Past Medical History:  Past Medical History:  Diagnosis Date   Anemia    Anxiety    Arthritis    Bipolar 1 disorder (HCC)  Borderline personality disorder (HCC)    Cancer (HCC) 2021   Anal cancer   had Chemo/ Radiation in [place]   Depression    Hepatitis    HEP C, treated in past   Hypertension    Liver cirrhosis (HCC)    Neuromuscular disorder (HCC)    tremors from psych meds.   PTSD (post-traumatic stress disorder)     Past Surgical History:  Procedure Laterality Date   CHOLECYSTECTOMY     Laparoscopic   DILATION AND CURETTAGE OF UTERUS     EYE SURGERY Bilateral    cataract removal   GASTRIC BYPASS  10/2019   Roux-n-Y  done in [place]   HERNIA REPAIR  2022   hernia repair and mass removal   LAPAROSCOPY N/A 01/06/2024   Procedure: LAPAROSCOPY DIAGNOSTIC;  Surgeon: Quentin Ore, MD;  Location: [facility];  Service: General;  Laterality: N/A;   SKIN SURGERY     had large abscess on left  shoulder blade that was drained and debrided   UPPER GI ENDOSCOPY N/A 01/06/2024   Procedure: UPPER ENDOSCOPY, LAPAROSCOPIC CLOSURE OF PETERSON'S SPACE;  Surgeon: Quentin Ore, MD;  Location: [facility];  Service: General;  Laterality: N/A;   VAGINAL HYSTERECTOMY     WRIST FRACTURE SURGERY Left 12/20/2020    Family Psychiatric History: son on medication for bipolar and depression, mother/father alcoholic   Family History:  Family History  Problem Relation Age of Onset   Brain cancer Brother    Rectal cancer Maternal Uncle        met to bones    Social History:  Academic/Vocational: Works at [employer]   Social History   Socioeconomic History   Marital status: Single    Spouse name: Not on file   Number of children: Not on file   Years of education: Not on file   Highest education level: Not on file  Occupational History   Not on file  Tobacco Use   Smoking status: Former    Current packs/day: 0.00    Types: Cigarettes    Quit date: 2002    Years since quitting: 23.1   Smokeless tobacco: Never  Vaping Use   Vaping status: Never Used  Substance and Sexual Activity   Alcohol use: Not Currently    Comment: Sober since 1999   Drug use: Not Currently    Types: Methamphetamines    Comment: Sober since 1999   Sexual activity: Not Currently  Other Topics Concern   Not on file  Social History Narrative   Not on file   Social Drivers of Health   Financial Resource Strain: Low Risk  (09/20/2021)   Overall Financial Resource Strain (CARDIA)    Difficulty of Paying Living Expenses: Not hard at all  Food Insecurity: No Food Insecurity (09/20/2021)   Hunger Vital Sign    Worried About Running Out of Food in the Last Year: Never true    Ran Out of Food in the Last Year: Never true  Transportation Needs: No Transportation Needs (09/20/2021)   PRAPARE - Administrator, Civil Service (Medical): No    Lack of Transportation (Non-Medical): No  Physical  Activity: Inactive (09/20/2021)   Exercise Vital Sign    Days of Exercise per Week: 0 days    Minutes of Exercise per Session: 0 min  Stress: Stress Concern Present (09/20/2021)   [employer] of Occupational Health - Occupational Stress Questionnaire    Feeling of Stress : To some  extent  Social Connections: Moderately Isolated (09/20/2021)   Social Connection and Isolation Panel [NHANES]    Frequency of Communication with Friends and Family: More than three times a week    Frequency of Social Gatherings with Friends and Family: More than three times a week    Attends Religious Services: More than 4 times per year    Active Member of [employer] or Organizations: No    Attends Banker Meetings: Never    Marital Status: Never married    Allergies:  Allergies  Allergen Reactions   Lisinopril Cough    Current Medications: Current Outpatient Medications  Medication Sig Dispense Refill   FLUoxetine (PROZAC) 20 MG capsule Take 1 capsule (20 mg total) by mouth at bedtime. Take with 40mg  of fluoxetine nightly. 30 capsule 2   acetaminophen (TYLENOL) 500 MG tablet Take 1,500 mg by mouth 2 (two) times daily as needed for moderate pain.     FLUoxetine (PROZAC) 40 MG capsule Take 1 capsule (40 mg total) by mouth at bedtime. Take with 20mg  fluoxetine nightly. 30 capsule 2   lamoTRIgine (LAMICTAL) 200 MG tablet Take 1 tablet (200 mg total) by mouth daily. 30 tablet 2   lithium carbonate 150 MG capsule Take 1 capsule (150 mg total) by mouth at bedtime. 14 capsule 0   loperamide (IMODIUM A-D) 2 MG tablet Take 2 mg by mouth 3 (three) times daily as needed for diarrhea or loose stools.     Multiple Vitamin (MULTIVITAMIN) tablet Take 1 tablet by mouth daily.     pantoprazole (PROTONIX) 40 MG tablet Take 1 tablet (40 mg total) by mouth daily. 90 tablet 0   propranolol (INDERAL) 10 MG tablet TAKE 1 TABLET (10 MG TOTAL) BY MOUTH 2 (TWO) TIMES DAILY AS NEEDED (ANXIETY/PANIC) 60 tablet 2    QUEtiapine (SEROQUEL) 200 MG tablet Take 1 tablet (200 mg total) by mouth at bedtime. 30 tablet 2   sucralfate (CARAFATE) 1 g tablet Take 1 tablet (1 g total) by mouth 2 (two) times daily. 180 tablet 3   No current facility-administered medications for this visit.    ROS: Review of Systems  Constitutional:  Positive for appetite change and unexpected weight change.  HENT:         Dental implants  Gastrointestinal:  Positive for abdominal pain and diarrhea. Negative for constipation, nausea and vomiting.  Endocrine: Positive for cold intolerance, heat intolerance and polyphagia.  Musculoskeletal:  Positive for arthralgias, back pain and gait problem.  Skin:        No hair loss  Neurological:  Positive for tremors and headaches. Negative for dizziness.       Falls  Psychiatric/Behavioral:  Positive for decreased concentration, dysphoric mood, sleep disturbance and suicidal ideas. Negative for hallucinations and self-injury. The patient is nervous/anxious.     Objective:  Psychiatric Specialty Exam: There were no vitals taken for this visit.There is no height or weight on file to calculate BMI.  General Appearance: Casual, Fairly Groomed, and appears stated age  Eye Contact:  Fair  Speech:  Normal Rate and slight impairment to articulation with dental implants  Volume:  Normal  Mood:   "Alright.  I feel pretty good at this point"  Affect:  Appropriate, Congruent, Depressed, and less anxious but with spontaneous smile and laugh  Thought Content: Logical and Hallucinations: Tactile   Suicidal Thoughts:  None in session today but still intermittent and passive when occurring usually in response to relationship injury or thinking about ongoing  abdominal pain; down to once weekly  Homicidal Thoughts:  No  Thought Process:  Coherent, Goal Directed, and Linear  Orientation:  Full (Time, Place, and Person)    Memory:  Grossly intact   Judgment:  Fair  Insight:  Fair  Concentration:   Concentration: Fair  Recall:  not formally assessed   Fund of Knowledge: Fair  Language: Fair  Psychomotor Activity:  Normal  Akathisia:  No  AIMS (if indicated): Done, 0 as tremor of bilateral hands not included  Assets:  Communication Skills Desire for Improvement Financial Resources/Insurance Housing Leisure Time Resilience Social Support Talents/Skills Transportation Vocational/Educational  ADL's:  Intact  Cognition: WNL  Sleep:  Fair   PE: General: sits comfortably in view of camera; no acute distress Pulm: no increased work of breathing on room air  MSK: all extremity movements appear intact  Neuro: no focal neurological deficits observed,, intention tremor in bilateral hands Gait & Station: unable to assess by video    Metabolic Disorder Labs: No results found for: "HGBA1C", "MPG" No results found for: "PROLACTIN" No results found for: "CHOL", "TRIG", "HDL", "CHOLHDL", "VLDL", "LDLCALC" Lab Results  Component Value Date   TSH 1.450 12/03/2023    Therapeutic Level Labs: No results found for: "LITHIUM" No results found for: "VALPROATE" No results found for: "CBMZ"  Screenings:  PHQ2-9    Flowsheet Row Office Visit from 07/01/2023 in [facility] [place] Office Visit from 09/20/2021 in [facility]  PHQ-2 Total Score 6 2  PHQ-9 Total Score 18 6      Flowsheet Row Admission (Discharged) from 01/06/2024 in [place] [facility] ED from 11/01/2023 in [facility] Department at [facility] ED from 09/21/2023 in [facility] Emergency Department at [facility]  C-SSRS RISK CATEGORY No Risk No Risk No Risk       Collaboration of Care: Collaboration of Care: Medication Management AEB as above and Primary Care Provider AEB as above  Patient/Guardian was advised Release of Information must be obtained prior to any record release in order to collaborate their care with  an outside provider. Patient/Guardian was advised if they have not already done so to contact the registration department to sign all necessary forms in order for [place] to release information regarding their care.   Consent: Patient/Guardian gives verbal consent for treatment and assignment of benefits for services provided during this visit. Patient/Guardian expressed understanding and agreed to proceed.   Televisit via video: I connected with patient on 02/10/24 at 11:30 AM EST by a video enabled telemedicine application and verified that I am speaking with the correct person using two identifiers.  Location: Patient: At home Provider: remote office in [place]   I discussed the limitations of evaluation and management by telemedicine and the availability of in person appointments. The patient expressed understanding and agreed to proceed.  I discussed the assessment and treatment plan with the patient. The patient was provided an opportunity to ask questions and all were answered. The patient agreed with the plan and demonstrated an understanding of the instructions.   The patient was advised to call back or seek an in-person evaluation if the symptoms worsen or if the condition fails to improve as anticipated.  I provided 30 minutes dedicated to the care of this patient via video on the date of this encounter to include chart review, face-to-face time with the patient, medication management/counseling, coordination of care with PCP.  Remi Deter  Elray Buba, MD 02/10/2024, 11:57 AM

## 2024-02-10 NOTE — Patient Instructions (Addendum)
 We decreased the lithium to 150 mg nightly for the next 2 weeks.  Then discontinue.  This should help with the tremor.  Once off the lithium you can increase the fluoxetine to 40 mg capsule with a 20 mg capsule nightly for a total dose of 60 mg once nightly.  Please coordinate with your PCP about that hip pain and falls but please decrease the Tylenol to no more than 1000 mg every 8 hours for a total daily dose of 3000 mg.  When you coordinate with your primary care doctor please obtain a lipid panel and A1c for monitoring while you are still on the Seroquel.

## 2024-02-14 ENCOUNTER — Other Ambulatory Visit (HOSPITAL_COMMUNITY): Payer: Self-pay | Admitting: Psychiatry

## 2024-02-14 DIAGNOSIS — F319 Bipolar disorder, unspecified: Secondary | ICD-10-CM

## 2024-02-14 DIAGNOSIS — F603 Borderline personality disorder: Secondary | ICD-10-CM

## 2024-02-14 DIAGNOSIS — F410 Panic disorder [episodic paroxysmal anxiety] without agoraphobia: Secondary | ICD-10-CM

## 2024-03-05 ENCOUNTER — Other Ambulatory Visit (HOSPITAL_COMMUNITY): Payer: Self-pay | Admitting: Psychiatry

## 2024-03-05 DIAGNOSIS — F332 Major depressive disorder, recurrent severe without psychotic features: Secondary | ICD-10-CM

## 2024-03-05 DIAGNOSIS — F4310 Post-traumatic stress disorder, unspecified: Secondary | ICD-10-CM

## 2024-03-05 DIAGNOSIS — F410 Panic disorder [episodic paroxysmal anxiety] without agoraphobia: Secondary | ICD-10-CM

## 2024-03-12 ENCOUNTER — Other Ambulatory Visit (HOSPITAL_COMMUNITY): Payer: Self-pay | Admitting: Psychiatry

## 2024-03-12 DIAGNOSIS — F410 Panic disorder [episodic paroxysmal anxiety] without agoraphobia: Secondary | ICD-10-CM

## 2024-03-12 DIAGNOSIS — F319 Bipolar disorder, unspecified: Secondary | ICD-10-CM

## 2024-03-12 DIAGNOSIS — F603 Borderline personality disorder: Secondary | ICD-10-CM

## 2024-03-23 ENCOUNTER — Telehealth (INDEPENDENT_AMBULATORY_CARE_PROVIDER_SITE_OTHER): Payer: Medicaid Other | Admitting: Psychiatry

## 2024-03-23 ENCOUNTER — Encounter (HOSPITAL_COMMUNITY): Payer: Self-pay | Admitting: Psychiatry

## 2024-03-23 DIAGNOSIS — F4310 Post-traumatic stress disorder, unspecified: Secondary | ICD-10-CM

## 2024-03-23 DIAGNOSIS — F603 Borderline personality disorder: Secondary | ICD-10-CM

## 2024-03-23 DIAGNOSIS — G4709 Other insomnia: Secondary | ICD-10-CM

## 2024-03-23 DIAGNOSIS — F411 Generalized anxiety disorder: Secondary | ICD-10-CM

## 2024-03-23 DIAGNOSIS — Z79899 Other long term (current) drug therapy: Secondary | ICD-10-CM

## 2024-03-23 DIAGNOSIS — F410 Panic disorder [episodic paroxysmal anxiety] without agoraphobia: Secondary | ICD-10-CM

## 2024-03-23 DIAGNOSIS — F319 Bipolar disorder, unspecified: Secondary | ICD-10-CM

## 2024-03-23 DIAGNOSIS — F332 Major depressive disorder, recurrent severe without psychotic features: Secondary | ICD-10-CM

## 2024-03-23 MED ORDER — LAMOTRIGINE 200 MG PO TABS: 200.0000 mg | ORAL_TABLET | Freq: Every day | ORAL | 2 refills | Status: DC

## 2024-03-23 NOTE — Progress Notes (Signed)
 [facility] MD Outpatient Progress Note  03/23/2024 11:59 AM Emri Sample  MRN:  [id number]  Assessment:  Barbara Meyer presents for follow-up evaluation. Today, 03/23/24, patient reports overall improvement of both anxiety and depression with the discontinuation of lithium also leading to some improvement to tremor.  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  There was no dip with the discontinuation of lithium which supports not being necessary in regimen and with titration of fluoxetine her chronic passive suicidal ideation has not been present for the last 3 weeks.  Still no plan or intent when occurred last and the biggest protective factor is not wanting to cause her children harm from her own suicide at this point.  Still has propranolol available as needed to assist the tremor and panic attacks.  Encouraged her to follow up with primary care about ongoing falls and pain related to this.  She is still excessively using Tylenol but down to 1000 mg 3-4 times daily dose.  She will follow up with PCP about this. Thankfully was able to have surgery which significantly improved her pain which is also likely improving her mood after finding a peptic ulcer and removing scar tissue from her stomach.  Sleeping has worsened and with repeated awakening during the night this could be a recurrence of sleep apnea encouraged her to reach out with her primary care. She also needs lipid panel, A1c while on Seroquel and we will plan on coming by the office to get those sets of labs or whenever she sees her PCP next.  Binge eating has also improved as well as self-harm slightly improved with not noticing the scratching sensation anymore which is an improvement.  Until suicidal ideation has been resolved for a longer period of time we will need to keep the number of pills on hand at a low amount.  Follow-up in 4 weeks.  Provider transition discussed.   For  safety, her acute risk factors for suicide are: Diagnosis of borderline personality disorder, current diagnosis of depression, self-harm by scratching, suicidal ideation without intent or plan.  Her chronic risk factors for suicide are: History of substance use, history of alcohol use disorder, historical diagnosis of bipolar disorder, diagnosed borderline personality disorder, social isolation.  Her protective factors are: Employment, supportive family, beloved pets in the home, actively seeking and engaging with mental health care no suicidal ideation, no access to firearms, hope for the future.  While future events cannot be fully predicted she does not currently meet IVC criteria and can be continued as an outpatient.  Identifying Information: Barbara Meyer is a 63 y.o. 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 Initial evaluation of depression and anxiety on 07/01/23; please see that note for full case formulation.  We will plan on discontinuing lithium as next agent as at its current dose it is unlikely to be providing much benefit anyway.  Was able to get a lithium level that was drawn by gastroenterologist  and found to be 0.4 but as previously discussed much lower likelihood that she has a bipolar spectrum of illness and much more likely this is a primary borderline personality disorder which would not require higher  doses of lithium.   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 caffeine use and history of snoring Past medication trials:  Status of problem: Chronic and stable Interventions: -- Patient to cut back on caffeine use --Seroquel, Prozac as above   # History of vitamin D and iron deficiency with history of gastric bypass surgery Past medication trials:  Status of problem: Chronic and stable Interventions: -- Coordinate with PCP for vitamin D level and consider nutrition referral   # High risk medication use: Lithium and Lamictal Past medication trials:  Status of problem: Chronic and stable Interventions: --Lithium level on 07/24/2023 0.4, CMP within normal limits on 08/23/23 -- Thyroid panel normal as of 12/03/2023   # Long-term current use of antipsychotic: Seroquel Past medication trials:  Status of problem: Chronic and stable Interventions: -- EKG with QTc of 455 ms on 09/23/2023 --Still needs updated A1c and lipid panel  #  Abdominal pain with peptic ulcer status post resection in January 2025 Past medication trials:  Status of problem: Improving Interventions: -- Currently followed by GI   # Falls with chronic hip pain and Tylenol overuse Past medication trials:  Status of problem: New to provider Interventions: -- Decrease acetaminophen to no more than 1000 mg every 8 hours --Encourage follow-up with PCP --Reduce polypharmacy where able   # History of tobacco use disorder in sustained remission Past medication trials:  Status of problem: In remission Interventions: -- Continue to encourage abstinence  Patient was given contact information for behavioral health clinic and was instructed to call 911 for emergencies.   Subjective:  Chief Complaint:  Chief Complaint  Patient presents with   Borderline personality disorder   Follow-up   Anxiety   Depression   Stress    Interval History: Doing good, holding steady. 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 She was able to get a raise as well but did mess up her insurance for Medicaid. Went to sign up with [employer] but has to get covered by her job but can't get a letter from [employer] saying they are dropping her because she can qualify for family planning. Was able to stop the lithium and not noticing much change with stopping it. Thinks tremor improved a bit off of it. Will be flying youngest son and daughter at the end of June and looking forward to that. Oldest son still in charge of end of life things. Will sleep from  1a to 730a and is waking repeatedly during the night nad wonders if sleep apnea is coming back. Still feels tired until she drinks coffee but then feels fine. Naps when needed. Follow up for peptic ulcer surgery went well and was able to repair the hernia and removed scar tissue on stomach. No further follow up needed and no pain since then. No further falls but can still get uneasy on her feet at times like she is losing  her balance, walks with feet far apart. Was able to cut back on the amount of tylenol but there are times when use still excessive; tries to keep 1000mg  at a time. Reviewed need to cut back to protect liver. Able to joke about physical pain. SI is much less frequent and last was a couple of weeks ago and likes this change. Still no plan.    Visit Diagnosis:    ICD-10-CM   1. Borderline personality disorder (HCC)  F60.3 lamoTRIgine (LAMICTAL) 200 MG tablet    2. Generalized anxiety disorder with panic attacks  F41.1    F41.0     3. Major depressive disorder, recurrent severe without psychotic features (HCC)  F33.2 lamoTRIgine (LAMICTAL) 200 MG tablet    4. PTSD (post-traumatic stress disorder)  F43.10     5. Long term current use of antipsychotic medication  Z79.899     6. Insomnia with night caffeine use  G47.09     7. 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, history of alcohol use disorder in sustained remission, history of tobacco use disorder in sustained remission Medication trials: effexor (doesn't remember), lithium (hard to say if working and possible tremor), seroquel (hard to say if working), latuda (hard to say if worked), trazodone (doesn't remember), hydroxyzine (doesn't remember), lamictal (doesn't remember), propranolol, wellbutrin (felt like being on meth again and was more isolative), Prozac (effective) Previous psychiatrist/therapist: yes to both Hospitalizations: none Suicide attempts: none SIB: scratches at times on arms Hx of violence towards others: none Current access to  guns: none Hx of trauma/abuse: none Substance use: No alcohol at present, has been 25 years since last drink as a recovering alcoholic since 1999. No tobacco products since in roughly 2002. No other drugs at present. Quit methamphetamine in 1999 as well; used IV and was treated for hepatitis C.   Past Medical History:  Past Medical History:  Diagnosis Date   Anemia    Anxiety    Arthritis    Bipolar 1 disorder (HCC)    Borderline personality disorder (HCC)    Cancer (HCC) 2021   Anal cancer   had Chemo/ Radiation in [place]   Depression    Hepatitis    HEP C, treated in past   Hypertension    Liver cirrhosis (HCC)    Long-term use of high-risk medication: Lithium 07/01/2023   Neuromuscular disorder (HCC)    tremors from psych meds.   PTSD (post-traumatic stress disorder)     Past Surgical History:  Procedure Laterality Date   CHOLECYSTECTOMY     Laparoscopic   DILATION AND CURETTAGE OF UTERUS     EYE SURGERY Bilateral    cataract removal  GASTRIC BYPASS  10/2019   Roux-n-Y  done in [place]   HERNIA REPAIR  2022   hernia repair and mass removal   LAPAROSCOPY N/A 01/06/2024   Procedure: LAPAROSCOPY DIAGNOSTIC;  Surgeon: Quentin Ore, MD;  Location: [facility];  Service: General;  Laterality: N/A;   SKIN SURGERY     had large abscess on left shoulder blade that was drained and debrided   UPPER GI ENDOSCOPY N/A 01/06/2024   Procedure: UPPER ENDOSCOPY, LAPAROSCOPIC CLOSURE OF PETERSON'S SPACE;  Surgeon: Quentin Ore, MD;  Location: [facility];  Service: General;  Laterality: N/A;   VAGINAL HYSTERECTOMY     WRIST FRACTURE SURGERY Left 12/20/2020    Family Psychiatric History: son on medication for bipolar and depression, mother/father alcoholic   Family History:  Family History  Problem Relation Age of Onset   Brain cancer Brother    Rectal cancer Maternal Uncle        met to bones    Social History:  Academic/Vocational: Works at [employer]    Social History   Socioeconomic History   Marital status: Single    Spouse name: Not on file   Number of children: Not on file   Years of education: Not on file   Highest education level: Not on file  Occupational History   Not on file  Tobacco Use   Smoking status: Former    Current packs/day: 0.00    Types: Cigarettes    Quit date: 2002    Years since quitting: 23.2   Smokeless tobacco: Never  Vaping Use   Vaping status: Never Used  Substance and Sexual Activity   Alcohol use: Not Currently    Comment: Sober since 1999   Drug use: Not Currently    Types: Methamphetamines    Comment: Sober since 1999   Sexual activity: Not Currently  Other Topics Concern   Not on file  Social History Narrative   Not on file   Social Drivers of Health   Financial Resource Strain: Low Risk  (09/20/2021)   Overall Financial Resource Strain (CARDIA)    Difficulty of Paying Living Expenses: Not hard at all  Food Insecurity: No Food Insecurity (09/20/2021)   Hunger Vital Sign    Worried About Running Out of Food in the Last Year: Never true    Ran Out of Food in the Last Year: Never true  Transportation Needs: No Transportation Needs (09/20/2021)   PRAPARE - Administrator, Civil Service (Medical): No    Lack of Transportation (Non-Medical): No  Physical Activity: Inactive (09/20/2021)   Exercise Vital Sign    Days of Exercise per Week: 0 days    Minutes of Exercise per Session: 0 min  Stress: Stress Concern Present (09/20/2021)   [employer] of Occupational Health - Occupational Stress Questionnaire    Feeling of Stress : To some extent  Social Connections: Moderately Isolated (09/20/2021)   Social Connection and Isolation Panel [NHANES]    Frequency of Communication with Friends and Family: More than three times a week    Frequency of Social Gatherings with Friends and Family: More than three times a week    Attends Religious Services: More than 4 times per year     Active Member of [employer] or Organizations: No    Attends Banker Meetings: Never    Marital Status: Never married    Allergies:  Allergies  Allergen Reactions   Lisinopril Cough    Current Medications:  Current Outpatient Medications  Medication Sig Dispense Refill   acetaminophen (TYLENOL) 500 MG tablet Take 1,500 mg by mouth 2 (two) times daily as needed for moderate pain.     FLUoxetine (PROZAC) 20 MG capsule Take 1 capsule (20 mg total) by mouth at bedtime. Take with 40mg  of fluoxetine nightly. 30 capsule 2   FLUoxetine (PROZAC) 40 MG capsule Take 1 capsule (40 mg total) by mouth at bedtime. Take with 20mg  fluoxetine nightly. 30 capsule 2   lamoTRIgine (LAMICTAL) 200 MG tablet Take 1 tablet (200 mg total) by mouth daily. 30 tablet 2   loperamide (IMODIUM A-D) 2 MG tablet Take 2 mg by mouth 3 (three) times daily as needed for diarrhea or loose stools.     Multiple Vitamin (MULTIVITAMIN) tablet Take 1 tablet by mouth daily.     pantoprazole (PROTONIX) 40 MG tablet Take 1 tablet (40 mg total) by mouth daily. 90 tablet 0   propranolol (INDERAL) 10 MG tablet TAKE 1 TABLET (10 MG TOTAL) BY MOUTH 2 (TWO) TIMES DAILY AS NEEDED (ANXIETY/PANIC) 60 tablet 2   QUEtiapine (SEROQUEL) 200 MG tablet Take 1 tablet (200 mg total) by mouth at bedtime. 30 tablet 2   sucralfate (CARAFATE) 1 g tablet Take 1 tablet (1 g total) by mouth 2 (two) times daily. 180 tablet 3   No current facility-administered medications for this visit.    ROS: Review of Systems  Constitutional:  Positive for appetite change. Negative for unexpected weight change.  HENT:         Dental implants  Gastrointestinal:  Positive for abdominal pain and diarrhea. Negative for constipation, nausea and vomiting.  Endocrine: Positive for cold intolerance and heat intolerance. Negative for polyphagia.  Musculoskeletal:  Positive for arthralgias, back pain and gait problem.  Skin:        No hair loss  Neurological:   Positive for tremors and headaches. Negative for dizziness.       Falls  Psychiatric/Behavioral:  Positive for decreased concentration, dysphoric mood, sleep disturbance and suicidal ideas. Negative for hallucinations and self-injury. The patient is nervous/anxious.     Objective:  Psychiatric Specialty Exam: There were no vitals taken for this visit.There is no height or weight on file to calculate BMI.  General Appearance: Casual, Fairly Groomed, and appears stated age  Eye Contact:  Fair  Speech:  Normal Rate and slight impairment to articulation with dental implants  Volume:  Normal  Mood:   "Good"  Affect:  Appropriate, Congruent, Depressed, and less anxious but with spontaneous smile and laugh and brighter than before  Thought Content: Logical and Hallucinations: Tactile   Suicidal Thoughts:  None in session today but still intermittent and passive when occurring usually and none in the last 3 weeks  Homicidal Thoughts:  No  Thought Process:  Coherent, Goal Directed, and Linear  Orientation:  Full (Time, Place, and Person)    Memory:  Grossly intact   Judgment:  Fair  Insight:  Fair  Concentration:  Concentration: Fair  Recall:  not formally assessed   Fund of Knowledge: Fair  Language: Fair  Psychomotor Activity:  Normal  Akathisia:  No  AIMS (if indicated): Done, 0 as tremor of bilateral hands not included  Assets:  Communication Skills Desire for Improvement Financial Resources/Insurance Housing Leisure Time Resilience Social Support Talents/Skills Transportation Vocational/Educational  ADL's:  Intact  Cognition: WNL  Sleep:  Fair   PE: General: sits comfortably in view of camera; no acute distress Pulm: no increased work  of breathing on room air  MSK: all extremity movements appear intact  Neuro: no focal neurological deficits observed,, intention tremor in bilateral hands Gait & Station: unable to assess by video    Metabolic Disorder Labs: No results  found for: "HGBA1C", "MPG" No results found for: "PROLACTIN" No results found for: "CHOL", "TRIG", "HDL", "CHOLHDL", "VLDL", "LDLCALC" Lab Results  Component Value Date   TSH 1.450 12/03/2023    Therapeutic Level Labs: No results found for: "LITHIUM" No results found for: "VALPROATE" No results found for: "CBMZ"  Screenings:  PHQ2-9    Flowsheet Row Office Visit from 07/01/2023 in [facility] [place] Office Visit from 09/20/2021 in [facility]  PHQ-2 Total Score 6 2  PHQ-9 Total Score 18 6      Flowsheet Row Admission (Discharged) from 01/06/2024 in [facility] from 11/01/2023 in [facility] at [facility] ED from 09/21/2023 in [facility] at [facility]  C-SSRS RISK CATEGORY No Risk No Risk No Risk       Collaboration of Care: Collaboration of Care: Medication Management AEB as above and Primary Care Provider AEB as above  Patient/Guardian was advised Release of Information must be obtained prior to any record release in order to collaborate their care with an outside provider. Patient/Guardian was advised if they have not already done so to contact the registration department to sign all necessary forms in order for [place] to release information regarding their care.   Consent: Patient/Guardian gives verbal consent for treatment and assignment of benefits for services provided during this visit. Patient/Guardian expressed understanding and agreed to proceed.   Televisit via video: I connected with patient on 03/23/24 at 11:30 AM EDT by a video enabled telemedicine application and verified that I am speaking with the correct person using two identifiers.  Location: Patient: At home Provider: remote office in [place]   I discussed the limitations of evaluation and management by telemedicine and the availability of in person appointments. The  patient expressed understanding and agreed to proceed.  I discussed the assessment and treatment plan with the patient. The patient was provided an opportunity to ask questions and all were answered. The patient agreed with the plan and demonstrated an understanding of the instructions.   The patient was advised to call back or seek an in-person evaluation if the symptoms worsen or if the condition fails to improve as anticipated.  I provided 20 minutes dedicated to the care of this patient via video on the date of this encounter to include chart review, face-to-face time with the patient, medication management/counseling, coordination of care with PCP.  Elsie Lincoln, MD 03/23/2024, 11:59 AM

## 2024-03-23 NOTE — Patient Instructions (Addendum)
 We did not make any medication changes today.  Keep up the good work with self-care and try to touch base with your primary care doctor so they can get the new sleep study ordered and you may also want to ask about getting the updated lipid panel and A1c.

## 2024-04-01 ENCOUNTER — Other Ambulatory Visit (HOSPITAL_COMMUNITY): Payer: Self-pay | Admitting: Psychiatry

## 2024-04-01 DIAGNOSIS — F332 Major depressive disorder, recurrent severe without psychotic features: Secondary | ICD-10-CM

## 2024-04-01 DIAGNOSIS — F410 Panic disorder [episodic paroxysmal anxiety] without agoraphobia: Secondary | ICD-10-CM

## 2024-04-01 DIAGNOSIS — F4310 Post-traumatic stress disorder, unspecified: Secondary | ICD-10-CM

## 2024-04-20 ENCOUNTER — Telehealth (HOSPITAL_COMMUNITY): Admitting: Psychiatry

## 2024-04-20 ENCOUNTER — Encounter (HOSPITAL_COMMUNITY): Payer: Self-pay | Admitting: Psychiatry

## 2024-04-20 DIAGNOSIS — F319 Bipolar disorder, unspecified: Secondary | ICD-10-CM

## 2024-04-20 DIAGNOSIS — F332 Major depressive disorder, recurrent severe without psychotic features: Secondary | ICD-10-CM | POA: Diagnosis not present

## 2024-04-20 DIAGNOSIS — F603 Borderline personality disorder: Secondary | ICD-10-CM | POA: Diagnosis not present

## 2024-04-20 DIAGNOSIS — Z8659 Personal history of other mental and behavioral disorders: Secondary | ICD-10-CM

## 2024-04-20 DIAGNOSIS — F411 Generalized anxiety disorder: Secondary | ICD-10-CM

## 2024-04-20 DIAGNOSIS — F4310 Post-traumatic stress disorder, unspecified: Secondary | ICD-10-CM | POA: Diagnosis not present

## 2024-04-20 DIAGNOSIS — F410 Panic disorder [episodic paroxysmal anxiety] without agoraphobia: Secondary | ICD-10-CM

## 2024-04-20 DIAGNOSIS — G4709 Other insomnia: Secondary | ICD-10-CM

## 2024-04-20 DIAGNOSIS — Z79899 Other long term (current) drug therapy: Secondary | ICD-10-CM

## 2024-04-20 DIAGNOSIS — R45851 Suicidal ideations: Secondary | ICD-10-CM

## 2024-04-20 MED ORDER — FLUOXETINE HCL 40 MG PO CAPS: 40.0000 mg | ORAL_CAPSULE | Freq: Every day | ORAL | 1 refills | Status: AC

## 2024-04-20 MED ORDER — PROPRANOLOL HCL 10 MG PO TABS: 10.0000 mg | ORAL_TABLET | Freq: Two times a day (BID) | ORAL | 5 refills | Status: AC | PRN

## 2024-04-20 MED ORDER — LAMOTRIGINE 200 MG PO TABS
200.0000 mg | ORAL_TABLET | Freq: Every day | ORAL | 1 refills | Status: AC
Start: 2024-04-20 — End: ?

## 2024-04-20 MED ORDER — FLUOXETINE HCL 20 MG PO CAPS
20.0000 mg | ORAL_CAPSULE | Freq: Every evening | ORAL | 1 refills | Status: AC
Start: 2024-04-20 — End: ?

## 2024-04-20 MED ORDER — QUETIAPINE FUMARATE 200 MG PO TABS: 200.0000 mg | ORAL_TABLET | Freq: Every day | ORAL | 1 refills | Status: AC

## 2024-04-20 MED ORDER — TRAZODONE HCL 50 MG PO TABS: 50.0000 mg | ORAL_TABLET | Freq: Every day | ORAL | 1 refills | Status: AC

## 2024-04-20 NOTE — Progress Notes (Signed)
 [facility] MD Outpatient Progress Note  04/20/2024 12:07 PM Barbara Meyer  MRN:  [id number]  Assessment:  Barbara Meyer presents for follow-up evaluation. Today, 04/20/24, patient reports worsening of insomnia, depression, anxiety, irritability in the setting of new workplace stresses at her new job which ultimately resulted in requesting to be transferred back to her old job.  As such would still view no return of suicidal ideation or self-harm thoughts in the setting of this extreme stress as significant progress and efficacy of overall medication regimen.  We will provide trazodone given previous use but would not expect this to need to be taken for a long period of time when social stressors die down.  Also, would still consider overall improvement of both anxiety and depression with the discontinuation of lithium  and also leading to some improvement to tremor.  As previously discussed, there was no dip with the discontinuation of lithium  which supports not being necessary in regimen and with titration of fluoxetine  her chronic passive suicidal ideation has not been present for the last the last 2 months and will now provide 90-day supplies of medication.  Still no plan or intent when occurred last and the biggest protective factor is not wanting to cause her children harm from her own suicide at this point.  Still has propranolol  available as needed to assist the tremor and panic attacks.  Encouraged her to follow up with primary care about falls and pain related to this.  She is still excessively using Tylenol  but down to 1000 mg 3-4 times daily dose.  She will follow up with PCP about this. Thankfully was able to have surgery which significantly improved her abdominal pain which is also likely improving her mood after finding a peptic ulcer and removing scar tissue from her stomach.  Unfortunately still having chronic diarrhea and this is next to ask her to follow up on.  As above sleeping has worsened and  with repeated awakening during the night this could be a recurrence of sleep apnea encouraged her to reach out with her primary care. She also needs lipid panel, A1c while on Seroquel  and we will plan on coming by the office to get those sets of labs or whenever she sees her PCP next.  Binge eating has also improved.  No follow-up planned due to provider transition.   For safety, her acute risk factors for suicide are: Diagnosis of borderline personality disorder, work stress, current diagnosis of depression.  Her chronic risk factors for suicide are: History of substance use, history of alcohol use disorder, historical diagnosis of bipolar disorder, diagnosed borderline personality disorder, social isolation.  Her protective factors are: Employment, supportive family, beloved pets in the home, actively seeking and engaging with mental health care no suicidal ideation, no access to firearms, hope for the future.  While future events cannot be fully predicted she does not currently meet IVC criteria and can be continued as an outpatient.  Identifying Information: Barbara Meyer is a 63 y.o. 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  Initial evaluation of depression and anxiety on 07/01/23; please see that note for full case formulation.  We will plan on discontinuing lithium  as next agent as at its current dose it is unlikely to  be providing much benefit anyway.  Was able to get a lithium  level that was drawn by gastroenterologist and found to be 0.4 but as previously discussed much lower likelihood that she has a bipolar spectrum of illness and much more likely this is a primary borderline personality disorder which would not require higher doses of lithium .   Plan:   # Borderline personality disorder  historical diagnosis of bipolar 1 disorder rule out substance induced from history of methamphetamine and alcohol use disorder Past medication trials: See medication trials below Status of problem: chronic with mild exacerbation Interventions: -- Continue Seroquel  200 mg nightly for now --Continue Lamictal  200 mg daily for now --Continue to assess for accuracy of bipolar diagnosis   # Major depressive disorder, recurrent moderate now without passive suicidal ideation Past medication trials:  Status of problem: chronic with mild exacerbation Interventions: -- Seroquel , Lamictal  as above --Psychotherapy  -- continue Prozac  60 mg once nightly (s7/8/24, i9/13/24, i11/22/24, i3/3/25)   # Generalized anxiety disorder with panic attacks Past medication trials:  Status of problem: chronic with mild exacerbation Interventions: -- Prozac , Seroquel , psychotherapy as above --Continue propranolol  10 mg twice daily and we will continue to assess if still needed   # Psychophysiologic insomnia with p.m. caffeine use and history of snoring Past medication trials:  Status of problem: Chronic with moderate exacerbation Interventions: -- Patient to cut back on caffeine use --Seroquel , Prozac  as above   # History of vitamin D and iron deficiency with history of gastric bypass surgery Past medication trials:  Status of problem: Chronic and stable Interventions: -- Coordinate with PCP for vitamin D level and consider nutrition referral   # High risk medication use: Lamictal  Past medication trials:  Status of problem:  Chronic and stable Interventions: --will get updated CMP/CBC by September 2025   # Long-term current use of antipsychotic: Seroquel  Past medication trials:  Status of problem: Chronic and stable Interventions: -- EKG with QTc of 455 ms on 09/23/2023 --Still needs updated A1c and lipid panel  # Abdominal pain with peptic ulcer status post resection in January 2025 Past medication trials:  Status of problem: Improving Interventions: -- Currently followed by GI   # Falls with chronic hip pain and Tylenol  overuse Past medication trials:  Status of problem: Chronic and stable Interventions: -- Decrease acetaminophen  to no more than 1000 mg every 8 hours --Encourage follow-up with PCP --Reduce polypharmacy where able   # History of tobacco use disorder in sustained remission Past medication trials:  Status of problem: In remission Interventions: -- Continue to encourage abstinence  Patient was given contact information for behavioral health clinic and was instructed to call 911 for emergencies.   Subjective:  Chief Complaint:  Chief Complaint  Patient presents with   Borderline personality disorder   Depression   Anxiety   Follow-up   Stress    Interval History: Has been noticing some issues the last couple of weeks. 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 Was able to walk away  from several verbal escalations by these staff. Provided validation for experience. No return of SI or self harm thoughts. Needs to call GI as the chronic diarrhea is still ongoing. Reviewed sleep apnea could still be coming back but with social stressors changing sleep should return to adequate status; will use trazodone as next trial for a short period. No  further falls but can still get uneasy on her feet at times like she is losing her balance, walks with feet far apart. Was able to cut back on the amount of tylenol  but there are times when use still excessive; tries to keep 1000mg  at a time. Reviewed need to cut back to protect liver. Still planning on flying youngest son and daughter at the end of June and looking forward to that.   Visit Diagnosis:    ICD-10-CM   1. Borderline personality disorder (HCC)  F60.3 QUEtiapine  (SEROQUEL ) 200 MG tablet    lamoTRIgine  (LAMICTAL ) 200 MG tablet    2. Generalized anxiety disorder with panic attacks  F41.1 QUEtiapine  (SEROQUEL ) 200 MG tablet   F41.0 propranolol  (INDERAL ) 10 MG tablet    FLUoxetine  (PROZAC ) 40 MG capsule    FLUoxetine  (PROZAC ) 20 MG capsule    3. History of bipolar disorder  F31.9 QUEtiapine  (SEROQUEL ) 200 MG tablet    lamoTRIgine  (LAMICTAL ) 200 MG tablet    4. Major depressive disorder, recurrent severe without psychotic features (HCC)  F33.2 lamoTRIgine  (LAMICTAL ) 200 MG tablet    FLUoxetine  (PROZAC ) 40 MG capsule    FLUoxetine  (PROZAC ) 20 MG capsule    5. PTSD (post-traumatic stress disorder)  F43.10 FLUoxetine  (PROZAC ) 40 MG capsule    FLUoxetine  (PROZAC ) 20 MG capsule    6. Suicidal ideation without plan or intent  R45.851 FLUoxetine  (PROZAC ) 40 MG capsule    7. Long term current use of antipsychotic medication  Z79.899     8. 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, history of alcohol use disorder in sustained remission, history of tobacco use disorder in sustained remission Medication trials:  effexor (doesn't remember), lithium  (hard to say if working and possible tremor), seroquel  (hard to say if working), latuda (hard to say if worked), trazodone (doesn't remember), hydroxyzine (doesn't remember), lamictal  (doesn't remember), propranolol , wellbutrin (felt like being on meth again and was more isolative), Prozac  (effective) Previous psychiatrist/therapist: yes to both Hospitalizations: none Suicide attempts: none SIB: scratches at times on arms Hx of violence towards others: none Current access to guns: none Hx of trauma/abuse: none Substance use: No alcohol at present, has been 25 years since last drink as a recovering alcoholic since 1999. No tobacco products since in roughly 2002. No other drugs at present. Quit methamphetamine in 1999 as well; used IV and was treated for hepatitis C.   Past Medical History:  Past Medical History:  Diagnosis Date   Anemia    Anxiety    Arthritis    Bipolar 1 disorder (HCC)    Borderline personality disorder (HCC)    Cancer (HCC) 2021   Anal cancer   had Chemo/ Radiation in [place]    Depression    Hepatitis    HEP C, treated in past   Hypertension  Liver cirrhosis (HCC)    Long-term use of high-risk medication: Lithium  07/01/2023   Neuromuscular disorder (HCC)    tremors from psych meds.   PTSD (post-traumatic stress disorder)     Past Surgical History:  Procedure Laterality Date   CHOLECYSTECTOMY     Laparoscopic   DILATION AND CURETTAGE OF UTERUS     EYE SURGERY Bilateral    cataract removal   GASTRIC BYPASS  10/2019   Roux-n-Y  done in [place]    HERNIA REPAIR  2022   hernia repair and mass removal   LAPAROSCOPY N/A 01/06/2024   Procedure: LAPAROSCOPY DIAGNOSTIC;  Surgeon: Junie Olds, MD;  Location: [facility];  Service: General;  Laterality: N/A;   SKIN SURGERY     had large abscess on left shoulder blade that was drained and debrided   UPPER GI ENDOSCOPY N/A 01/06/2024   Procedure: UPPER ENDOSCOPY,  LAPAROSCOPIC CLOSURE OF PETERSON'S SPACE;  Surgeon: Junie Olds, MD;  Location: [facility];  Service: General;  Laterality: N/A;   VAGINAL HYSTERECTOMY     WRIST FRACTURE SURGERY Left 12/20/2020    Family Psychiatric History: son on medication for bipolar and depression, mother/father alcoholic   Family History:  Family History  Problem Relation Age of Onset   Brain cancer Brother    Rectal cancer Maternal Uncle        met to bones    Social History:  Academic/Vocational: Works at [employer]   Social History   Socioeconomic History   Marital status: Single    Spouse name: Not on file   Number of children: Not on file   Years of education: Not on file   Highest education level: Not on file  Occupational History   Not on file  Tobacco Use   Smoking status: Former    Current packs/day: 0.00    Types: Cigarettes    Quit date: 2002    Years since quitting: 23.3   Smokeless tobacco: Never  Vaping Use   Vaping status: Never Used  Substance and Sexual Activity   Alcohol use: Not Currently    Comment: Sober since 1999   Drug use: Not Currently    Types: Methamphetamines    Comment: Sober since 1999   Sexual activity: Not Currently  Other Topics Concern   Not on file  Social History Narrative   Not on file   Social Drivers of Health   Financial Resource Strain: Low Risk  (09/20/2021)   Overall Financial Resource Strain (CARDIA)    Difficulty of Paying Living Expenses: Not hard at all  Food Insecurity: No Food Insecurity (09/20/2021)   Hunger Vital Sign    Worried About Running Out of Food in the Last Year: Never true    Ran Out of Food in the Last Year: Never true  Transportation Needs: No Transportation Needs (09/20/2021)   PRAPARE - Administrator, Civil Service (Medical): No    Lack of Transportation (Non-Medical): No  Physical Activity: Inactive (09/20/2021)   Exercise Vital Sign    Days of Exercise per Week: 0 days    Minutes of Exercise per  Session: 0 min  Stress: Stress Concern Present (09/20/2021)   [employer] of Occupational Health - Occupational Stress Questionnaire    Feeling of Stress : To some extent  Social Connections: Moderately Isolated (09/20/2021)   Social Connection and Isolation Panel [NHANES]    Frequency of Communication with Friends and Family: More than three times a week  Frequency of Social Gatherings with Friends and Family: More than three times a week    Attends Religious Services: More than 4 times per year    Active Member of Clubs or Organizations: No    Attends Banker Meetings: Never    Marital Status: Never married    Allergies:  Allergies  Allergen Reactions   Lisinopril Cough    Current Medications: Current Outpatient Medications  Medication Sig Dispense Refill   acetaminophen  (TYLENOL ) 500 MG tablet Take 1,500 mg by mouth 2 (two) times daily as needed for moderate pain.     FLUoxetine  (PROZAC ) 20 MG capsule Take 1 capsule (20 mg total) by mouth at bedtime. Take with 40mg  of fluoxetine  nightly. 90 capsule 1   FLUoxetine  (PROZAC ) 40 MG capsule Take 1 capsule (40 mg total) by mouth at bedtime. Take with 20mg  fluoxetine  nightly. 90 capsule 1   lamoTRIgine  (LAMICTAL ) 200 MG tablet Take 1 tablet (200 mg total) by mouth daily. 90 tablet 1   loperamide (IMODIUM A-D) 2 MG tablet Take 2 mg by mouth 3 (three) times daily as needed for diarrhea or loose stools.     Multiple Vitamin (MULTIVITAMIN) tablet Take 1 tablet by mouth daily.     propranolol  (INDERAL ) 10 MG tablet Take 1 tablet (10 mg total) by mouth 2 (two) times daily as needed (Anxiety/panic). 60 tablet 5   QUEtiapine  (SEROQUEL ) 200 MG tablet Take 1 tablet (200 mg total) by mouth at bedtime. 90 tablet 1   traZODone (DESYREL) 50 MG tablet Take 1 tablet (50 mg total) by mouth at bedtime. 90 tablet 1   No current facility-administered medications for this visit.    ROS: Review of Systems  Constitutional:  Positive for  appetite change and fatigue. Negative for unexpected weight change.  HENT:         Dental implants  Gastrointestinal:  Positive for abdominal pain and diarrhea. Negative for constipation, nausea and vomiting.  Endocrine: Positive for cold intolerance and heat intolerance. Negative for polyphagia.  Musculoskeletal:  Positive for arthralgias, back pain and gait problem.  Skin:        No hair loss  Neurological:  Positive for tremors and headaches. Negative for dizziness.       Falls  Psychiatric/Behavioral:  Positive for decreased concentration, dysphoric mood and sleep disturbance. Negative for hallucinations, self-injury and suicidal ideas. The patient is nervous/anxious.     Objective:  Psychiatric Specialty Exam: There were no vitals taken for this visit.There is no height or weight on file to calculate BMI.  General Appearance: Casual, Fairly Groomed, and appears stated age  Eye Contact:  Fair  Speech:  Normal Rate and slight impairment to articulation with dental implants  Volume:  Normal  Mood:   "This new job at me stressed out"  Affect:  Appropriate, Congruent, Depressed, and anxious but with spontaneous smile and laugh  Thought Content: Logical and Hallucinations: Tactile   Suicidal Thoughts:  None in session today and none present for the last 2 months  Homicidal Thoughts:  No  Thought Process:  Coherent, Goal Directed, and Linear  Orientation:  Full (Time, Place, and Person)    Memory:  Grossly intact   Judgment:  Fair  Insight:  Fair  Concentration:  Concentration: Fair  Recall:  not formally assessed   Fund of Knowledge: Fair  Language: Fair  Psychomotor Activity:  Normal  Akathisia:  No  AIMS (if indicated): Done, 0 as tremor of bilateral hands not included  Assets:  Communication Skills Desire for Improvement Financial Resources/Insurance Housing Leisure Time Resilience Social Support Talents/Skills Transportation Vocational/Educational  ADL's:  Intact   Cognition: WNL  Sleep:  Poor   PE: General: sits comfortably in view of camera; no acute distress Pulm: no increased work of breathing on room air  MSK: all extremity movements appear intact  Neuro: no focal neurological deficits observed,, intention tremor in bilateral hands Gait & Station: unable to assess by video    Metabolic Disorder Labs: No results found for: "HGBA1C", "MPG" No results found for: "PROLACTIN" No results found for: "CHOL", "TRIG", "HDL", "CHOLHDL", "VLDL", "LDLCALC" Lab Results  Component Value Date   TSH 1.450 12/03/2023    Therapeutic Level Labs: No results found for: "LITHIUM " No results found for: "VALPROATE" No results found for: "CBMZ"  Screenings:  PHQ2-9    Flowsheet Row Office Visit from 07/01/2023 in [facility] [place] [place] Office Visit from 09/20/2021 in [facility]  PHQ-2 Total Score 6 2  PHQ-9 Total Score 18 6      Flowsheet Row Admission (Discharged) from 01/06/2024 in [place] [facility] ED from 11/01/2023 in [facility] ED from 09/21/2023 in [facility] Emergency Department at [facility]  C-SSRS RISK CATEGORY No Risk No Risk No Risk       Collaboration of Care: Collaboration of Care: Medication Management AEB as above and Primary Care Provider AEB as above  Patient/Guardian was advised Release of Information must be obtained prior to any record release in order to collaborate their care with an outside provider. Patient/Guardian was advised if they have not already done so to contact the registration department to sign all necessary forms in order for us  to release information regarding their care.   Consent: Patient/Guardian gives verbal consent for treatment and assignment of benefits for services provided during this visit. Patient/Guardian expressed understanding and agreed to proceed.   Televisit via  video: I connected with patient on 04/20/24 at 11:30 AM EDT by a video enabled telemedicine application and verified that I am speaking with the correct person using two identifiers.  Location: Patient: At home Provider: remote office in [place]   I discussed the limitations of evaluation and management by telemedicine and the availability of in person appointments. The patient expressed understanding and agreed to proceed.  I discussed the assessment and treatment plan with the patient. The patient was provided an opportunity to ask questions and all were answered. The patient agreed with the plan and demonstrated an understanding of the instructions.   The patient was advised to call back or seek an in-person evaluation if the symptoms worsen or if the condition fails to improve as anticipated.  I provided 30 minutes dedicated to the care of this patient via video on the date of this encounter to include chart review, face-to-face time with the patient, medication management/counseling, coordination of care with PCP.  Madie Schilling, MD 04/20/2024, 12:07 PM

## 2024-04-20 NOTE — Patient Instructions (Signed)
 We added trazodone 50 mg nightly to your regimen today.  Give yourself 8 hours to be asleep once you take this medication.  I would imagine you will not need this for a very long once you are in the new work setting and when you want to come off the medication and try cutting the tablet in half for a few days before fully discontinuing.

## 2024-05-31 ENCOUNTER — Ambulatory Visit
Admission: EM | Admit: 2024-05-31 | Discharge: 2024-05-31 | Disposition: A | Discharge status: Home / Self Care | Attending: Nurse Practitioner | Admitting: Nurse Practitioner

## 2024-05-31 DIAGNOSIS — R21 Rash and other nonspecific skin eruption: Secondary | ICD-10-CM | POA: Diagnosis not present

## 2024-05-31 MED ORDER — PREDNISONE 20 MG PO TABS: 40.0000 mg | ORAL_TABLET | Freq: Every day | ORAL | 0 refills | Status: AC

## 2024-05-31 MED ORDER — DEXAMETHASONE SODIUM PHOSPHATE 10 MG/ML IJ SOLN
10.0000 mg | INTRAMUSCULAR | Status: AC
  Administered 2024-05-31: 10 mg via INTRAMUSCULAR

## 2024-05-31 MED ORDER — TRIAMCINOLONE ACETONIDE 0.1 % EX CREA: 1.0000 | TOPICAL_CREAM | Freq: Two times a day (BID) | CUTANEOUS | 0 refills | Status: AC

## 2024-05-31 NOTE — Discharge Instructions (Signed)
 You were given an injection of Decadron  10 mg today.  Start the prednisone on 06/01/2024. Take medication as prescribed. May also take over-the-counter Zyrtec during the daytime and Benadryl at bedtime to help with itching. Avoid hot baths or showers while symptoms persist.  Recommend taking lukewarm baths. May apply cool cloths to the area to help with itching or discomfort. Avoid scratching, rubbing, or manipulating the areas while symptoms persist. Recommend over-the-counter Aveeno Colloidal Oatmeal Bath to use to help with drying and itching. You may follow-up in this clinic or with your primary care physician if symptoms fail to improve. Follow-up as needed.

## 2024-05-31 NOTE — ED Provider Notes (Signed)
 [facility]    CSN: [id number] Arrival date & time: 05/31/24  1102      History   Chief Complaint No chief complaint on file.   HPI Barbara Meyer is a 63 y.o. female.   The history is provided by the patient.   Patient presents with a 1 day history of rash to her bilateral arms, neck, and upper back.  She states that the rash is itchy.  She denies any obvious triggers.  Further denies exposure to new soaps, medications, lotions, foods, or detergents.  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  States she has not used any medication for her symptoms.  Past Medical History:  Diagnosis Date   Anemia    Anxiety    Arthritis    Bipolar 1 disorder (HCC)    Borderline personality disorder (HCC)    Cancer (HCC) 2021   Anal cancer   had Chemo/ Radiation in [place]    Depression    Hepatitis    HEP C, treated in past   Hypertension    Liver cirrhosis (HCC)    Long-term use of high-risk medication: Lithium  07/01/2023   Neuromuscular disorder (HCC)    tremors from psych meds.   PTSD (post-traumatic stress disorder)     Patient Active Problem List   Diagnosis Date Noted   Tylenol  overuse 02/10/2024   Suicidal ideation without plan or intent 09/06/2023   Generalized anxiety disorder with panic attacks 07/01/2023   Vitamin D deficiency 07/01/2023   Hypertension 07/01/2023   History of bipolar disorder 07/01/2023   History of hepatitis C 07/01/2023   Cirrhosis of liver (HCC) 07/01/2023   Major depressive disorder, recurrent severe without psychotic features (HCC) 07/01/2023   Long term current use of antipsychotic medication 07/01/2023   Insomnia with night caffeine use 07/01/2023   Anal cancer (HCC) 09/20/2021   History of tobacco use disorder 12/27/2020   Noninfective gastroenteritis and colitis, unspecified 12/27/2020   Borderline personality disorder (HCC) 01/22/2017   PTSD (post-traumatic stress  disorder) 01/22/2017    Past Surgical History:  Procedure Laterality Date   CHOLECYSTECTOMY     Laparoscopic   DILATION AND CURETTAGE OF UTERUS     EYE SURGERY Bilateral    cataract removal   GASTRIC BYPASS  10/2019   Roux-n-Y  done in [place]    HERNIA REPAIR  2022   hernia repair and mass removal   LAPAROSCOPY N/A 01/06/2024   Procedure: LAPAROSCOPY DIAGNOSTIC;  Surgeon: Junie Olds, MD;  Location: [facility];  Service: General;  Laterality: N/A;   SKIN SURGERY     had large abscess on left shoulder blade that was drained and debrided   UPPER GI ENDOSCOPY N/A 01/06/2024   Procedure: UPPER ENDOSCOPY, LAPAROSCOPIC CLOSURE OF PETERSON'S SPACE;  Surgeon: Junie Olds, MD;  Location: [facility];  Service: General;  Laterality: N/A;   VAGINAL HYSTERECTOMY     WRIST FRACTURE SURGERY Left 12/20/2020    OB History   No obstetric history on file.      Home Medications    Prior to Admission medications   Medication Sig Start Date End Date Taking? Authorizing Provider  acetaminophen  (TYLENOL ) 500 MG tablet Take 1,500 mg by mouth 2 (two) times daily as needed for moderate pain.    [provider]  FLUoxetine  (PROZAC ) 20 MG capsule Take 1 capsule (20 mg total) by mouth at bedtime. Take with 40mg  of fluoxetine  nightly. 04/20/24  Madie Schilling, MD  FLUoxetine  (PROZAC ) 40 MG capsule Take 1 capsule (40 mg total) by mouth at bedtime. Take with 20mg  fluoxetine  nightly. 04/20/24   Madie Schilling, MD  lamoTRIgine  (LAMICTAL ) 200 MG tablet Take 1 tablet (200 mg total) by mouth daily. 04/20/24   Madie Schilling, MD  loperamide (IMODIUM A-D) 2 MG tablet Take 2 mg by mouth 3 (three) times daily as needed for diarrhea or loose stools.    [provider]  Multiple Vitamin (MULTIVITAMIN) tablet Take 1 tablet by mouth daily.    [provider]  propranolol  (INDERAL ) 10 MG tablet Take 1 tablet (10 mg total) by mouth 2 (two) times daily as needed (Anxiety/panic).  04/20/24   Madie Schilling, MD  QUEtiapine  (SEROQUEL ) 200 MG tablet Take 1 tablet (200 mg total) by mouth at bedtime. 04/20/24   Madie Schilling, MD  traZODone  (DESYREL ) 50 MG tablet Take 1 tablet (50 mg total) by mouth at bedtime. 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  Updated Vital Signs BP 121/79 (BP Location: Right Arm)   Pulse 74   Temp 98.9 F (37.2 C) (Oral)   Resp 20   SpO2 93%   Visual Acuity Right Eye Distance:   Left Eye Distance:   Bilateral Distance:    Right Eye Near:   Left Eye Near:    Bilateral Near:     Physical Exam Vitals and nursing note reviewed.  Constitutional:      General: She is not in acute distress.    Appearance: Normal appearance.  Eyes:     Extraocular Movements: Extraocular movements intact.     Pupils: Pupils are equal, round, and reactive to light.  Cardiovascular:     Rate and Rhythm:  Normal rate and regular rhythm.  Pulmonary:     Effort: Pulmonary effort is normal.  Skin:    General:  Skin is warm and dry.     Findings: Rash present. Rash is macular and papular.     Comments: Course maculopapular rash noted to the bilateral upper extremities, neck, and upper back.  The areas are slightly raised.  There is no oozing, fluctuance, or drainage present.  Neurological:     General: No focal deficit present.     Mental Status: She is alert and oriented to person, place, and time.  Psychiatric:        Mood and Affect: Mood normal.        Behavior: Behavior normal.      UC Treatments / Results  Labs (all labs ordered are listed, but only abnormal results are displayed) Labs Reviewed - No data to display  EKG   Radiology No results found.  Procedures Procedures (including critical care time)  Medications Ordered in UC Medications - No data to display  Initial Impression / Assessment and Plan / UC Course  I have reviewed the triage vital signs and the nursing notes.  Pertinent labs & imaging results that were available during my care of the patient were reviewed by me and considered in my medical decision making (see chart for details).  Will treat for rash and nonspecific skin eruption with Decadron  10 mg.  Start prednisone 40 mg for the next 5 days along with triamcinolone cream 0.1% to apply topically.  Supportive care recommendations were provided and discussed with the patient to include over-the-counter antihistamines, cool compresses, and use of Aveeno colloidal oatmeal bath.  Discussed indications with patient regarding follow-up.  Patient was in agreement with this plan of care and verbalizes understanding.  All questions were answered.  Patient stable for discharge.   Final Clinical Impressions(s) / UC Diagnoses   Final diagnoses:  None   Discharge Instructions   None    ED Prescriptions   None    PDMP not reviewed this encounter.    Hardy Lia, NP 05/31/24 1124

## 2024-05-31 NOTE — ED Triage Notes (Signed)
 Pt reports she has a rash on her arms and back x 1 day

## 2024-08-12 ENCOUNTER — Ambulatory Visit: Admission: EM | Admit: 2024-08-12 | Discharge: 2024-08-12 | Disposition: A | Discharge status: Home / Self Care

## 2025-01-15 ENCOUNTER — Emergency Department (HOSPITAL_COMMUNITY)
Admission: EM | Admit: 2025-01-15 | Discharge: 2025-01-16 | Disposition: A | Attending: Emergency Medicine | Admitting: Emergency Medicine

## 2025-01-15 ENCOUNTER — Encounter (HOSPITAL_COMMUNITY): Payer: Self-pay

## 2025-01-15 DIAGNOSIS — R109 Unspecified abdominal pain: Secondary | ICD-10-CM | POA: Diagnosis present

## 2025-01-15 DIAGNOSIS — A084 Viral intestinal infection, unspecified: Secondary | ICD-10-CM | POA: Diagnosis not present

## 2025-01-15 LAB — COMPREHENSIVE METABOLIC PANEL WITH GFR
ALT: 17 U/L (ref 0–44)
AST: 25 U/L (ref 15–41)
Albumin: 4.5 g/dL (ref 3.5–5.0)
Alkaline Phosphatase: 94 U/L (ref 38–126)
Anion gap: 17 — ABNORMAL HIGH (ref 5–15)
BUN: 17 mg/dL (ref 8–23)
CO2: 22 mmol/L (ref 22–32)
Calcium: 9.4 mg/dL (ref 8.9–10.3)
Chloride: 97 mmol/L — ABNORMAL LOW (ref 98–111)
Creatinine, Ser: 0.81 mg/dL (ref 0.44–1.00)
GFR, Estimated: >60 mL/min
Glucose, Bld: 129 mg/dL — ABNORMAL HIGH (ref 70–99)
Potassium: 4.4 mmol/L (ref 3.5–5.1)
Sodium: 136 mmol/L (ref 135–145)
Total Bilirubin: 0.3 mg/dL (ref 0.0–1.2)
Total Protein: 7.3 g/dL (ref 6.5–8.1)

## 2025-01-15 LAB — LIPASE, BLOOD: Lipase: 14 U/L (ref 11–51)

## 2025-01-15 LAB — CBC
HCT: 38.6 % (ref 36.0–46.0)
Hemoglobin: 12.1 g/dL (ref 12.0–15.0)
MCH: 24.9 pg — ABNORMAL LOW (ref 26.0–34.0)
MCHC: 31.3 g/dL (ref 30.0–36.0)
MCV: 79.4 fL — ABNORMAL LOW (ref 80.0–100.0)
Platelets: 298 10*3/uL (ref 150–400)
RBC: 4.86 MIL/uL (ref 3.87–5.11)
RDW: 15.7 % — ABNORMAL HIGH (ref 11.5–15.5)
WBC: 10 10*3/uL (ref 4.0–10.5)
nRBC: 0 % (ref 0.0–0.2)

## 2025-01-15 LAB — URINALYSIS, ROUTINE W REFLEX MICROSCOPIC
Bacteria, UA: NONE SEEN
Bilirubin Urine: NEGATIVE
Glucose, UA: NEGATIVE mg/dL
Hgb urine dipstick: NEGATIVE
Ketones, ur: NEGATIVE mg/dL
Leukocytes,Ua: NEGATIVE
Nitrite: NEGATIVE
Protein, ur: 30 mg/dL — AB
Specific Gravity, Urine: 1.03 (ref 1.005–1.030)
pH: 5 (ref 5.0–8.0)

## 2025-01-15 LAB — MAGNESIUM: Magnesium: 1.9 mg/dL (ref 1.7–2.4)

## 2025-01-15 NOTE — ED Triage Notes (Signed)
 Pt comes in for central abd pain. Pt started to throw up bile when she does not have a gallbladder. S/s started this morning. A&Ox.4  Reposition helps pain for a while and drinking water will cause pt to burp which helps the pain some.   Pt is waiting for iron transfusions due to low hemoglobin.

## 2025-01-16 ENCOUNTER — Emergency Department (HOSPITAL_COMMUNITY)

## 2025-01-16 MED ORDER — SODIUM CHLORIDE 0.9 % IV BOLUS
1000.0000 mL | Freq: Once | INTRAVENOUS | Status: AC
  Administered 2025-01-16: 1000 mL via INTRAVENOUS

## 2025-01-16 MED ORDER — ONDANSETRON 4 MG PO TBDP: 4.0000 mg | ORAL_TABLET | Freq: Three times a day (TID) | ORAL | 0 refills | Status: AC | PRN

## 2025-01-16 MED ORDER — ONDANSETRON HCL 4 MG/2ML IJ SOLN
4.0000 mg | Freq: Once | INTRAMUSCULAR | Status: AC
  Administered 2025-01-16: 4 mg via INTRAVENOUS
  Filled 2025-01-16: qty 2

## 2025-01-16 MED ORDER — DICYCLOMINE HCL 20 MG PO TABS: 20.0000 mg | ORAL_TABLET | Freq: Two times a day (BID) | ORAL | 0 refills | Status: AC

## 2025-01-16 MED ORDER — MORPHINE SULFATE (PF) 4 MG/ML IV SOLN
4.0000 mg | Freq: Once | INTRAVENOUS | Status: AC
  Administered 2025-01-16: 4 mg via INTRAVENOUS
  Filled 2025-01-16: qty 1

## 2025-01-16 MED ORDER — IOHEXOL 300 MG/ML  SOLN
100.0000 mL | Freq: Once | INTRAMUSCULAR | Status: AC | PRN
  Administered 2025-01-16: 100 mL via INTRAVENOUS

## 2025-01-16 NOTE — ED Provider Notes (Signed)
 "  [place] EMERG[facility] DEPARTMENT AT [facility]  Provider Note  CSN: [id number] Arrival date & time: 01/15/25 2116  History Chief Complaint  Patient presents with   Abdominal Pain    Barbara Meyer is a 64 y.o. female with history of prior cholecystectomy reports she has had intermittent burning abdominal pain all through the day today associated with vomiting. No fever. Did not have diarrhea until after she arrived here. No blood. No fever.    Home Medications Prior to Admission medications  Medication Sig Start Date End Date Taking? Authorizing Provider  dicyclomine  (BENTYL ) 20 MG tablet Take 1 tablet (20 mg total) by mouth 2 (two) times daily. 01/16/25  Yes Roselyn Carlin NOVAK, MD  ondansetron  (ZOFRAN -ODT) 4 MG disintegrating tablet Take 1 tablet (4 mg total) by mouth every 8 (eight) hours as needed for nausea or vomiting. 01/16/25  Yes Roselyn Carlin NOVAK, MD  acetaminophen  (TYLENOL ) 500 MG tablet Take 1,500 mg by mouth 2 (two) times daily as needed for moderate pain.    [provider]  FLUoxetine  (PROZAC ) 20 MG capsule Take 1 capsule (20 mg total) by mouth at bedtime. Take with 40mg  of fluoxetine  nightly. 04/20/24   Barbra Jayson LABOR, MD  FLUoxetine  (PROZAC ) 40 MG capsule Take 1 capsule (40 mg total) by mouth at bedtime. Take with 20mg  fluoxetine  nightly. 04/20/24   Barbra Jayson LABOR, MD  lamoTRIgine  (LAMICTAL ) 200 MG tablet Take 1 tablet (200 mg total) by mouth daily. 04/20/24   Barbra Jayson LABOR, MD  loperamide (IMODIUM A-D) 2 MG tablet Take 2 mg by mouth 3 (three) times daily as needed for diarrhea or loose stools.    [provider]  Multiple Vitamin (MULTIVITAMIN) tablet Take 1 tablet by mouth daily.    [provider]  propranolol  (INDERAL ) 10 MG tablet Take 1 tablet (10 mg total) by mouth 2 (two) times daily as needed (Anxiety/panic). 04/20/24   Barbra Jayson LABOR, MD  QUEtiapine  (SEROQUEL ) 200 MG tablet Take 1 tablet (200 mg total) by mouth at  bedtime. 04/20/24   Barbra Jayson LABOR, MD  traZODone  (DESYREL ) 50 MG tablet Take 1 tablet (50 mg total) by mouth at bedtime. 04/20/24   Barbra Jayson LABOR, MD  triamcinolone  cream (KENALOG ) 0.1 % Apply 1 Application topically 2 (two) times daily. 05/31/24   Leath-Warren, Etta PARAS, NP     Allergies    Lisinopril   Review of Systems   Review of Systems Please see HPI for pertinent positives and negatives  Physical Exam BP 122/60   Pulse 78   Temp 99 F (37.2 C) (Oral)   Resp 19   Ht 5' 5.5 (1.664 m)   Wt 95.7 kg   SpO2 93%   BMI 34.58 kg/m   Physical Exam Vitals and nursing note reviewed.  Constitutional:      Appearance: Normal appearance.  HENT:     Head: Normocephalic and atraumatic.     Nose: Nose normal.     Mouth/Throat:     Mouth: Mucous membranes are moist.  Eyes:     Extraocular Movements: Extraocular movements intact.     Conjunctiva/sclera: Conjunctivae normal.  Cardiovascular:     Rate and Rhythm: Normal rate.  Pulmonary:     Effort: Pulmonary effort is normal.     Breath sounds: Normal breath sounds.  Abdominal:     General: Abdomen is flat.     Palpations: Abdomen is soft.     Tenderness: There is generalized abdominal tenderness. There  is no guarding. Negative signs include McBurney's sign.  Musculoskeletal:        General: No swelling. Normal range of motion.     Cervical back: Neck supple.  Skin:    General: Skin is warm and dry.  Neurological:     General: No focal deficit present.     Mental Status: She is alert.  Psychiatric:        Mood and Affect: Mood normal.     ED Results / Procedures / Treatments   EKG EKG Interpretation Date/Time:  Saturday January 16 2025 00:47:25 EST Ventricular Rate:  89 PR Interval:  166 QRS Duration:  82 QT Interval:  395 QTC Calculation: 481 R Axis:   50  Text Interpretation: Sinus rhythm Consider left atrial enlargement No significant change since last tracing Confirmed by Roselyn Dunnings [id number]) on  01/16/2025 1:18:42 AM  Procedures Procedures  Medications Ordered in the ED Medications  morphine  (PF) 4 MG/ML injection 4 mg (4 mg Intravenous Given 01/16/25 0204)  ondansetron  (ZOFRAN ) injection 4 mg (4 mg Intravenous Given 01/16/25 0203)  sodium chloride  0.9 % bolus 1,000 mL (1,000 mLs Intravenous New Bag/Given 01/16/25 0203)  iohexol  (OMNIPAQUE ) 300 MG/ML solution 100 mL (100 mLs Intravenous Contrast Given 01/16/25 0139)    Initial Impression and Plan  Patient here with abdominal pain, NVD. Exam is reassuring. Vitals are normal. Labs done in triage show unremarkable CBC, CMP, Lipase, Mag and UA. Will give pain/nausea meds for comfort and send for CT to ensure no SBO or other acute process.   ED Course   Clinical Course as of 01/16/25 0306  Sat Jan 16, 2025  0304 I personally viewed the images from radiology studies and agree with radiologist interpretation: CT shows some signs of enteritis, suspect viral etiology given symptoms. She is feeling better and ready to go home. Rx for zofran , bentyl  if needed. PCP follow up, RTED for any other concerns.   [CS]    Clinical Course User Index [CS] Roselyn Dunnings NOVAK, MD     MDM Rules/Calculators/A&P Medical Decision Making Amount and/or Complexity of Data Reviewed Labs: ordered. Decision-making details documented in ED Course. Radiology: ordered and independent interpretation performed. Decision-making details documented in ED Course.  Risk Prescription drug management. Parenteral controlled substances.     Final Clinical Impression(s) / ED Diagnoses Final diagnoses:  Viral gastroenteritis    Rx / DC Orders ED Discharge Orders          Ordered    ondansetron  (ZOFRAN -ODT) 4 MG disintegrating tablet  Every 8 hours PRN        01/16/25 0305    dicyclomine  (BENTYL ) 20 MG tablet  2 times daily        01/16/25 0305             Roselyn Dunnings NOVAK, MD 01/16/25 [phone]  "

## 2025-02-01 ENCOUNTER — Inpatient Hospital Stay: Admitting: Nurse Practitioner

## 2025-02-01 ENCOUNTER — Inpatient Hospital Stay
